# Patient Record
Sex: Female | Born: 1940 | Race: White | Hispanic: No | Marital: Married | Smoking: Never smoker
Health system: Southern US, Community
[De-identification: ages and names within clinical notes are randomized; demographics above are authoritative.]

## PROBLEM LIST (undated history)

## (undated) DIAGNOSIS — I10 Essential (primary) hypertension: Secondary | ICD-10-CM

## (undated) DIAGNOSIS — E119 Type 2 diabetes mellitus without complications: Secondary | ICD-10-CM

## (undated) DIAGNOSIS — G629 Polyneuropathy, unspecified: Secondary | ICD-10-CM

## (undated) DIAGNOSIS — IMO0001 Reserved for inherently not codable concepts without codable children: Secondary | ICD-10-CM

## (undated) DIAGNOSIS — IMO0002 Reserved for concepts with insufficient information to code with codable children: Secondary | ICD-10-CM

## (undated) DIAGNOSIS — C801 Malignant (primary) neoplasm, unspecified: Secondary | ICD-10-CM

## (undated) HISTORY — PX: ABDOMINAL HYSTERECTOMY: SHX81

---

## 2009-08-01 ENCOUNTER — Encounter: Admission: RE | Admit: 2009-08-01 | Discharge: 2009-08-01 | Payer: Self-pay | Admitting: Podiatry

## 2010-12-02 ENCOUNTER — Encounter: Payer: Self-pay | Admitting: Sports Medicine

## 2015-11-12 ENCOUNTER — Emergency Department (HOSPITAL_COMMUNITY): Payer: Medicare Other

## 2015-11-12 ENCOUNTER — Encounter (HOSPITAL_COMMUNITY): Payer: Self-pay

## 2015-11-12 ENCOUNTER — Inpatient Hospital Stay (HOSPITAL_COMMUNITY)
Admission: EM | Admit: 2015-11-12 | Discharge: 2015-11-15 | DRG: 184 | Disposition: A | Discharge status: Skilled Nursing Facility | Payer: Medicare Other | Attending: General Surgery | Admitting: General Surgery

## 2015-11-12 DIAGNOSIS — Z880 Allergy status to penicillin: Secondary | ICD-10-CM

## 2015-11-12 DIAGNOSIS — Z882 Allergy status to sulfonamides status: Secondary | ICD-10-CM

## 2015-11-12 DIAGNOSIS — E119 Type 2 diabetes mellitus without complications: Secondary | ICD-10-CM | POA: Insufficient documentation

## 2015-11-12 DIAGNOSIS — S81011A Laceration without foreign body, right knee, initial encounter: Secondary | ICD-10-CM | POA: Diagnosis present

## 2015-11-12 DIAGNOSIS — S81812A Laceration without foreign body, left lower leg, initial encounter: Secondary | ICD-10-CM | POA: Diagnosis present

## 2015-11-12 DIAGNOSIS — Y92410 Unspecified street and highway as the place of occurrence of the external cause: Secondary | ICD-10-CM

## 2015-11-12 DIAGNOSIS — S2220XA Unspecified fracture of sternum, initial encounter for closed fracture: Principal | ICD-10-CM | POA: Diagnosis present

## 2015-11-12 DIAGNOSIS — I10 Essential (primary) hypertension: Secondary | ICD-10-CM | POA: Diagnosis present

## 2015-11-12 DIAGNOSIS — Z885 Allergy status to narcotic agent status: Secondary | ICD-10-CM

## 2015-11-12 DIAGNOSIS — S71111A Laceration without foreign body, right thigh, initial encounter: Secondary | ICD-10-CM | POA: Diagnosis present

## 2015-11-12 DIAGNOSIS — Z881 Allergy status to other antibiotic agents status: Secondary | ICD-10-CM

## 2015-11-12 DIAGNOSIS — T07XXXA Unspecified multiple injuries, initial encounter: Secondary | ICD-10-CM

## 2015-11-12 DIAGNOSIS — Z85828 Personal history of other malignant neoplasm of skin: Secondary | ICD-10-CM

## 2015-11-12 DIAGNOSIS — S81811A Laceration without foreign body, right lower leg, initial encounter: Secondary | ICD-10-CM | POA: Diagnosis present

## 2015-11-12 DIAGNOSIS — S61411A Laceration without foreign body of right hand, initial encounter: Secondary | ICD-10-CM | POA: Diagnosis present

## 2015-11-12 DIAGNOSIS — T148 Other injury of unspecified body region: Secondary | ICD-10-CM | POA: Diagnosis present

## 2015-11-12 DIAGNOSIS — G629 Polyneuropathy, unspecified: Secondary | ICD-10-CM | POA: Diagnosis present

## 2015-11-12 DIAGNOSIS — S01419A Laceration without foreign body of unspecified cheek and temporomandibular area, initial encounter: Secondary | ICD-10-CM | POA: Diagnosis present

## 2015-11-12 DIAGNOSIS — S61511A Laceration without foreign body of right wrist, initial encounter: Secondary | ICD-10-CM | POA: Diagnosis present

## 2015-11-12 DIAGNOSIS — S81012A Laceration without foreign body, left knee, initial encounter: Secondary | ICD-10-CM | POA: Diagnosis present

## 2015-11-12 DIAGNOSIS — Z923 Personal history of irradiation: Secondary | ICD-10-CM

## 2015-11-12 DIAGNOSIS — D62 Acute posthemorrhagic anemia: Secondary | ICD-10-CM | POA: Diagnosis present

## 2015-11-12 HISTORY — DX: Essential (primary) hypertension: I10

## 2015-11-12 HISTORY — DX: Reserved for concepts with insufficient information to code with codable children: IMO0002

## 2015-11-12 HISTORY — DX: Malignant (primary) neoplasm, unspecified: C80.1

## 2015-11-12 HISTORY — DX: Reserved for inherently not codable concepts without codable children: IMO0001

## 2015-11-12 HISTORY — DX: Type 2 diabetes mellitus without complications: E11.9

## 2015-11-12 HISTORY — DX: Polyneuropathy, unspecified: G62.9

## 2015-11-12 LAB — COMPREHENSIVE METABOLIC PANEL
ALBUMIN: 3.5 g/dL (ref 3.5–5.0)
ALK PHOS: 71 U/L (ref 38–126)
ALT: 19 U/L (ref 14–54)
ANION GAP: 11 (ref 5–15)
AST: 22 U/L (ref 15–41)
BILIRUBIN TOTAL: 0.2 mg/dL — AB (ref 0.3–1.2)
BUN: 29 mg/dL — AB (ref 6–20)
CALCIUM: 9.2 mg/dL (ref 8.9–10.3)
CO2: 25 mmol/L (ref 22–32)
Chloride: 105 mmol/L (ref 101–111)
Creatinine, Ser: 1.25 mg/dL — ABNORMAL HIGH (ref 0.44–1.00)
GFR calc Af Amer: 48 mL/min — ABNORMAL LOW (ref >60)
GFR calc non Af Amer: 41 mL/min — ABNORMAL LOW (ref >60)
GLUCOSE: 137 mg/dL — AB (ref 65–99)
Potassium: 4.1 mmol/L (ref 3.5–5.1)
Sodium: 141 mmol/L (ref 135–145)
TOTAL PROTEIN: 6.3 g/dL — AB (ref 6.5–8.1)

## 2015-11-12 LAB — CDS SEROLOGY

## 2015-11-12 LAB — PROTIME-INR
INR: 0.94 (ref 0.00–1.49)
PROTHROMBIN TIME: 12.8 s (ref 11.6–15.2)

## 2015-11-12 LAB — GLUCOSE, CAPILLARY
GLUCOSE-CAPILLARY: 119 mg/dL — AB (ref 65–99)
GLUCOSE-CAPILLARY: 129 mg/dL — AB (ref 65–99)
GLUCOSE-CAPILLARY: 164 mg/dL — AB (ref 65–99)
Glucose-Capillary: 95 mg/dL (ref 65–99)

## 2015-11-12 LAB — ETHANOL

## 2015-11-12 LAB — CBC
HCT: 30.9 % — ABNORMAL LOW (ref 36.0–46.0)
Hemoglobin: 9.7 g/dL — ABNORMAL LOW (ref 12.0–15.0)
MCH: 28.1 pg (ref 26.0–34.0)
MCHC: 31.4 g/dL (ref 30.0–36.0)
MCV: 89.6 fL (ref 78.0–100.0)
Platelets: 243 10*3/uL (ref 150–400)
RBC: 3.45 MIL/uL — ABNORMAL LOW (ref 3.87–5.11)
RDW: 15.9 % — AB (ref 11.5–15.5)
WBC: 8.8 10*3/uL (ref 4.0–10.5)

## 2015-11-12 MED ORDER — SODIUM CHLORIDE 0.9 % IV BOLUS (SEPSIS)
125.0000 mL | Freq: Once | INTRAVENOUS | Status: AC
  Administered 2015-11-12: 1000 mL via INTRAVENOUS

## 2015-11-12 MED ORDER — HYDROMORPHONE HCL 1 MG/ML IJ SOLN: 0.5000 mg | INTRAMUSCULAR | Status: DC | PRN

## 2015-11-12 MED ORDER — DOCUSATE SODIUM 100 MG PO CAPS
100.0000 mg | ORAL_CAPSULE | Freq: Two times a day (BID) | ORAL | Status: DC
Start: 2015-11-12 — End: 2015-11-15
  Administered 2015-11-12 – 2015-11-15 (×7): 100 mg via ORAL
  Filled 2015-11-12 (×7): qty 1

## 2015-11-12 MED ORDER — HYDROCODONE-ACETAMINOPHEN 5-325 MG PO TABS
1.0000 | ORAL_TABLET | ORAL | Status: DC | PRN
  Administered 2015-11-12: 1 via ORAL
  Filled 2015-11-12: qty 1

## 2015-11-12 MED ORDER — HEPARIN SODIUM (PORCINE) 5000 UNIT/ML IJ SOLN
5000.0000 [IU] | Freq: Three times a day (TID) | INTRAMUSCULAR | Status: DC
  Administered 2015-11-12: 5000 [IU] via SUBCUTANEOUS
  Filled 2015-11-12: qty 1

## 2015-11-12 MED ORDER — NAPROXEN 250 MG PO TABS
500.0000 mg | ORAL_TABLET | Freq: Two times a day (BID) | ORAL | Status: DC
  Administered 2015-11-12 – 2015-11-15 (×7): 500 mg via ORAL
  Filled 2015-11-12 (×7): qty 2

## 2015-11-12 MED ORDER — IOHEXOL 300 MG/ML  SOLN
100.0000 mL | Freq: Once | INTRAMUSCULAR | Status: AC | PRN
  Administered 2015-11-12: 80 mL via INTRAVENOUS

## 2015-11-12 MED ORDER — KCL IN DEXTROSE-NACL 20-5-0.45 MEQ/L-%-% IV SOLN
INTRAVENOUS | Status: DC
  Administered 2015-11-12 – 2015-11-13 (×2): via INTRAVENOUS
  Filled 2015-11-12 (×5): qty 1000

## 2015-11-12 MED ORDER — ENOXAPARIN SODIUM 40 MG/0.4ML ~~LOC~~ SOLN
40.0000 mg | SUBCUTANEOUS | Status: DC
  Administered 2015-11-12 – 2015-11-14 (×3): 40 mg via SUBCUTANEOUS
  Filled 2015-11-12 (×4): qty 0.4

## 2015-11-12 MED ORDER — TRAMADOL HCL 50 MG PO TABS
50.0000 mg | ORAL_TABLET | Freq: Four times a day (QID) | ORAL | Status: DC | PRN
  Administered 2015-11-12 – 2015-11-13 (×4): 100 mg via ORAL
  Administered 2015-11-14: 50 mg via ORAL
  Administered 2015-11-14: 100 mg via ORAL
  Filled 2015-11-12 (×4): qty 2
  Filled 2015-11-12: qty 1
  Filled 2015-11-12: qty 2

## 2015-11-12 MED ORDER — INSULIN ASPART 100 UNIT/ML ~~LOC~~ SOLN
0.0000 [IU] | Freq: Three times a day (TID) | SUBCUTANEOUS | Status: DC
  Administered 2015-11-12 – 2015-11-13 (×2): 2 [IU] via SUBCUTANEOUS
  Administered 2015-11-13: 3 [IU] via SUBCUTANEOUS
  Administered 2015-11-14: 2 [IU] via SUBCUTANEOUS

## 2015-11-12 MED ORDER — ONDANSETRON HCL 4 MG/2ML IJ SOLN: 4.0000 mg | Freq: Four times a day (QID) | INTRAMUSCULAR | Status: DC | PRN

## 2015-11-12 MED ORDER — ONDANSETRON HCL 4 MG PO TABS: 4.0000 mg | ORAL_TABLET | Freq: Four times a day (QID) | ORAL | Status: DC | PRN
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
## 2015-11-12 NOTE — ED Notes (Signed)
MD at bedside. 

## 2015-11-12 NOTE — ED Notes (Signed)
Family at bedside. 
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
## 2015-11-12 NOTE — ED Notes (Signed)
Bleeding noted to L wrist/hand, laceration noted, pressure bandage noted. Pt c/o worsening chest pain

## 2015-11-13 LAB — GLUCOSE, CAPILLARY
Glucose-Capillary: 135 mg/dL — ABNORMAL HIGH (ref 65–99)
Glucose-Capillary: 136 mg/dL — ABNORMAL HIGH (ref 65–99)
Glucose-Capillary: 177 mg/dL — ABNORMAL HIGH (ref 65–99)

## 2015-11-13 MED ORDER — MONTELUKAST SODIUM 10 MG PO TABS
10.0000 mg | ORAL_TABLET | Freq: Every day | ORAL | Status: DC
  Administered 2015-11-13 – 2015-11-14 (×2): 10 mg via ORAL
  Filled 2015-11-13 (×2): qty 1

## 2015-11-13 MED ORDER — ASPIRIN EC 81 MG PO TBEC
81.0000 mg | DELAYED_RELEASE_TABLET | Freq: Every day | ORAL | Status: DC
  Administered 2015-11-13 – 2015-11-15 (×3): 81 mg via ORAL
  Filled 2015-11-13 (×3): qty 1

## 2015-11-13 MED ORDER — FLUTICASONE PROPIONATE 50 MCG/ACT NA SUSP
2.0000 | Freq: Every day | NASAL | Status: DC
  Administered 2015-11-13 – 2015-11-14 (×2): 2 via NASAL
  Filled 2015-11-13 (×2): qty 16

## 2015-11-13 MED ORDER — BRIMONIDINE TARTRATE 0.15 % OP SOLN
1.0000 [drp] | Freq: Two times a day (BID) | OPHTHALMIC | Status: DC
  Administered 2015-11-13 – 2015-11-15 (×3): 1 [drp] via OPHTHALMIC
  Filled 2015-11-13 (×2): qty 5

## 2015-11-13 MED ORDER — AMLODIPINE BESYLATE 5 MG PO TABS
5.0000 mg | ORAL_TABLET | Freq: Every day | ORAL | Status: DC
  Administered 2015-11-14 – 2015-11-15 (×2): 5 mg via ORAL
  Filled 2015-11-13 (×2): qty 1

## 2015-11-13 MED ORDER — PAROXETINE HCL 20 MG PO TABS
20.0000 mg | ORAL_TABLET | Freq: Every day | ORAL | Status: DC
  Administered 2015-11-15: 20 mg via ORAL
  Filled 2015-11-13 (×3): qty 1

## 2015-11-13 MED ORDER — LORATADINE 10 MG PO TABS
10.0000 mg | ORAL_TABLET | Freq: Every day | ORAL | Status: DC
  Administered 2015-11-13 – 2015-11-15 (×3): 10 mg via ORAL
  Filled 2015-11-13 (×3): qty 1

## 2015-11-13 MED ORDER — GABAPENTIN 300 MG PO CAPS
600.0000 mg | ORAL_CAPSULE | Freq: Three times a day (TID) | ORAL | Status: DC
  Administered 2015-11-13 – 2015-11-15 (×7): 600 mg via ORAL
  Filled 2015-11-13 (×7): qty 2

## 2015-11-13 MED ORDER — PANTOPRAZOLE SODIUM 40 MG PO TBEC
40.0000 mg | DELAYED_RELEASE_TABLET | Freq: Every day | ORAL | Status: DC
  Administered 2015-11-13 – 2015-11-15 (×3): 40 mg via ORAL
  Filled 2015-11-13 (×3): qty 1

## 2015-11-13 MED ORDER — FAMOTIDINE 20 MG PO TABS
20.0000 mg | ORAL_TABLET | Freq: Every day | ORAL | Status: DC
  Administered 2015-11-13 – 2015-11-14 (×2): 20 mg via ORAL
  Filled 2015-11-13 (×2): qty 1

## 2015-11-13 MED ORDER — ATORVASTATIN CALCIUM 40 MG PO TABS
40.0000 mg | ORAL_TABLET | Freq: Every day | ORAL | Status: DC
  Administered 2015-11-13 – 2015-11-14 (×2): 40 mg via ORAL
  Filled 2015-11-13 (×2): qty 1

## 2015-11-13 MED ORDER — DORZOLAMIDE HCL-TIMOLOL MAL 2-0.5 % OP SOLN
1.0000 [drp] | Freq: Two times a day (BID) | OPHTHALMIC | Status: DC
  Administered 2015-11-13 – 2015-11-15 (×3): 1 [drp] via OPHTHALMIC
  Filled 2015-11-13 (×3): qty 10

## 2015-11-13 NOTE — Clinical Social Work Placement (Signed)
   CLINICAL SOCIAL WORK PLACEMENT  NOTE  Date:  11/13/2015  Patient Details  Name: Sharon Garza MRN: [id number] Date of Birth: Aug 06, 1941  Clinical Social Work is seeking post-discharge placement for this patient at the [place] level of care (*CSW will initial, date and re-position this form in  chart as items are completed):  Yes   Patient/family provided with [place] Work Department's list of facilities offering this level of care within the geographic area requested by the patient (or if unable, by the patient's family).  Yes   Patient/family informed of their freedom to choose among providers that offer the needed level of care, that participate in Medicare, Medicaid or managed care program needed by the patient, have an available bed and are willing to accept the patient.  Yes   Patient/family informed of [place]'s ownership interest in [facility] and [facility], as well as of the fact that they are under no obligation to receive care at these facilities.  PASRR submitted to EDS on       PASRR number received on       Existing PASRR number confirmed on 11/13/15     FL2 transmitted to all facilities in geographic area requested by pt/family on 11/13/15     FL2 transmitted to all facilities within larger geographic area on       Patient informed that his/her managed care company has contracts with or will negotiate with certain facilities, including the following:            Patient/family informed of bed offers received.  Patient chooses bed at       Physician recommends and patient chooses bed at      Patient to be transferred to   on  .  Patient to be transferred to facility by       Patient family notified on   of transfer.  Name of family member notified:        PHYSICIAN       Additional Comment:
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
## 2015-11-13 NOTE — Progress Notes (Signed)
OT Cancellation Note  Patient Details Name: Autie Westenberger MRN: [id number] DOB: 07-11-1941   Cancelled Treatment:    Reason Eval/Treat Not Completed: Other (comment) (RN changing pts dressings). Will check back for OT eval as time allows.   Binnie Kand M.S., OTR/L Pager: [phone]  11/13/2015, 11:25 AM

## 2015-11-14 DIAGNOSIS — D62 Acute posthemorrhagic anemia: Secondary | ICD-10-CM | POA: Diagnosis not present

## 2015-11-14 DIAGNOSIS — T07XXXA Unspecified multiple injuries, initial encounter: Secondary | ICD-10-CM

## 2015-11-14 LAB — GLUCOSE, CAPILLARY
GLUCOSE-CAPILLARY: 99 mg/dL (ref 65–99)
GLUCOSE-CAPILLARY: 103 mg/dL — AB (ref 65–99)
Glucose-Capillary: 100 mg/dL — ABNORMAL HIGH (ref 65–99)
Glucose-Capillary: 121 mg/dL — ABNORMAL HIGH (ref 65–99)

## 2015-11-14 LAB — CREATININE, SERUM
Creatinine, Ser: 1.02 mg/dL — ABNORMAL HIGH (ref 0.44–1.00)
GFR calc non Af Amer: 52 mL/min — ABNORMAL LOW (ref >60)

## 2015-11-14 MED ORDER — BACITRACIN ZINC 500 UNIT/GM EX OINT
TOPICAL_OINTMENT | Freq: Two times a day (BID) | CUTANEOUS | Status: DC
  Administered 2015-11-14 – 2015-11-15 (×3): via TOPICAL
  Filled 2015-11-14 (×2): qty 28.35

## 2015-11-14 MED ORDER — POLYETHYLENE GLYCOL 3350 17 G PO PACK
17.0000 g | PACK | Freq: Every day | ORAL | Status: DC
  Filled 2015-11-14: qty 1

## 2015-11-14 MED ORDER — SODIUM CHLORIDE 0.9 % IV BOLUS (SEPSIS)
500.0000 mL | Freq: Once | INTRAVENOUS | Status: AC
  Administered 2015-11-14: 500 mL via INTRAVENOUS

## 2015-11-14 MED ORDER — METFORMIN HCL 500 MG PO TABS
500.0000 mg | ORAL_TABLET | Freq: Two times a day (BID) | ORAL | Status: DC
  Administered 2015-11-14 – 2015-11-15 (×2): 500 mg via ORAL
  Filled 2015-11-14 (×2): qty 1
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
## 2015-11-14 NOTE — Clinical Social Work Placement (Signed)
   CLINICAL SOCIAL WORK PLACEMENT  NOTE  Date:  11/14/2015  Patient Details  Name: Sharon Garza MRN: [id number] Date of Birth: 1941/06/16  Clinical Social Work is seeking post-discharge placement for this patient at the [place] level of care (*CSW will initial, date and re-position this form in  chart as items are completed):  Yes   Patient/family provided with [place] Work Department's list of facilities offering this level of care within the geographic area requested by the patient (or if unable, by the patient's family).  Yes   Patient/family informed of their freedom to choose among providers that offer the needed level of care, that participate in Medicare, Medicaid or managed care program needed by the patient, have an available bed and are willing to accept the patient.  Yes   Patient/family informed of [facility]'s ownership interest in [facility] and [facility], as well as of the fact that they are under no obligation to receive care at these facilities.  PASRR submitted to EDS on       PASRR number received on       Existing PASRR number confirmed on 11/13/15     FL2 transmitted to all facilities in geographic area requested by pt/family on 11/13/15     FL2 transmitted to all facilities within larger geographic area on       Patient informed that his/her managed care company has contracts with or will negotiate with certain facilities, including the following:        Yes   Patient/family informed of bed offers received.  Patient chooses bed at  [facility])     Physician recommends and patient chooses bed at      Patient to be transferred to  [facility]) on 11/15/15.  Patient to be transferred to facility by [facility]     Patient family notified on 11/14/15 of transfer.  Name of family member notified:  [name] (patient daughter) at bedside     PHYSICIAN Please prepare priority discharge summary, including  medications     Additional Comment:    Barbette Or, [place]

## 2015-11-14 NOTE — Clinical Social Work Note (Signed)
Clinical Social Worker continuing to follow patient and family for support and discharge planning needs.  Patient was offered a bed at [facility] and has accepted bed offer.  Patient family has agreed to complete paperwork in the morning and CSW to discharge patient via family car tomorrow afternoon.  Facility hopeful for patient spouse to be able to admit at discharge also, however will need to review all medical information prior to offering a bed.  CSW remains available for support and to facilitate patient discharge needs once medically stable.  Barbette Or, [place]
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
## 2015-11-15 LAB — GLUCOSE, CAPILLARY
GLUCOSE-CAPILLARY: 86 mg/dL (ref 65–99)
Glucose-Capillary: 107 mg/dL — ABNORMAL HIGH (ref 65–99)

## 2015-11-15 MED ORDER — TRAMADOL HCL 50 MG PO TABS: 50.0000 mg | ORAL_TABLET | Freq: Four times a day (QID) | ORAL | Status: DC | PRN

## 2015-11-15 MED ORDER — BACITRACIN ZINC 500 UNIT/GM EX OINT: TOPICAL_OINTMENT | Freq: Two times a day (BID) | CUTANEOUS | Status: DC

## 2015-11-15 NOTE — Care Management Important Message (Signed)
Important Message  Patient Details  Name: Sharon Garza MRN: [id number] Date of Birth: [birth year]   Medicare Important Message Given:  Yes    Hadrian Yarbrough P Delrae Hagey 11/15/2015, 1:23 PM
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
## 2015-11-15 NOTE — Clinical Social Work Note (Signed)
Clinical Social Worker facilitated patient discharge including contacting patient family and facility to confirm patient discharge plans.  Clinical information faxed to facility and family agreeable with plan.  CSW arranged transport via family to [place].  RN to call report prior to discharge.  Clinical Social Worker will sign off for now as social work intervention is no longer needed. Please consult [place] again if new need arises.  Barbette Or, [place]

## 2015-11-15 NOTE — Care Management Note (Signed)
Case Management Note  Patient Details  Name: Sharon Garza MRN: [id number] Date of Birth: [birth year]  Subjective/Objective:    Pt medically stable for dc today.                  Action/Plan: Plan dc to SNF today, per CSW arrangements.    Expected Discharge Date:   11/15/15               Expected Discharge Plan:  Skilled Nursing Facility  In-House Referral:  Clinical Social Work  Discharge planning Services  CM Consult  Post Acute Care Choice:    Choice offered to:     DME Arranged:    DME Agency:     HH Arranged:    [place] Agency:     Status of Service:  Completed, signed off  Medicare Important Message Given:    Date Medicare IM Given:    Medicare IM give by:    Date Additional Medicare IM Given:    Additional Medicare Important Message give by:     If discussed at [place] of Stay Meetings, dates discussed:    Additional Comments:  Reinaldo Raddle, RN, BSN  Trauma/Neuro ICU Case Manager [phone]

## 2015-11-15 NOTE — Progress Notes (Signed)
IV removed. All dressings changed.  Belongings packed. Transportation arranged with family to [place].  Report called to [employer].  Family taking patient to visit her husband in the ICU before travelling to [place].
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
## 2023-02-12 DIAGNOSIS — F419 Anxiety disorder, unspecified: Secondary | ICD-10-CM | POA: Diagnosis not present

## 2023-02-12 DIAGNOSIS — G8929 Other chronic pain: Secondary | ICD-10-CM | POA: Diagnosis not present

## 2023-02-12 DIAGNOSIS — F331 Major depressive disorder, recurrent, moderate: Secondary | ICD-10-CM | POA: Diagnosis not present

## 2023-02-12 DIAGNOSIS — E1143 Type 2 diabetes mellitus with diabetic autonomic (poly)neuropathy: Secondary | ICD-10-CM | POA: Diagnosis not present

## 2023-02-12 DIAGNOSIS — I739 Peripheral vascular disease, unspecified: Secondary | ICD-10-CM | POA: Diagnosis not present

## 2023-02-12 DIAGNOSIS — F329 Major depressive disorder, single episode, unspecified: Secondary | ICD-10-CM | POA: Diagnosis not present

## 2023-02-12 DIAGNOSIS — E785 Hyperlipidemia, unspecified: Secondary | ICD-10-CM | POA: Diagnosis not present

## 2023-02-12 DIAGNOSIS — I10 Essential (primary) hypertension: Secondary | ICD-10-CM | POA: Diagnosis not present

## 2023-02-17 DIAGNOSIS — I10 Essential (primary) hypertension: Secondary | ICD-10-CM | POA: Diagnosis not present

## 2023-02-17 DIAGNOSIS — Z85828 Personal history of other malignant neoplasm of skin: Secondary | ICD-10-CM | POA: Diagnosis not present

## 2023-02-17 DIAGNOSIS — C44519 Basal cell carcinoma of skin of other part of trunk: Secondary | ICD-10-CM | POA: Diagnosis not present

## 2023-02-17 DIAGNOSIS — Z86008 Personal history of in-situ neoplasm of other site: Secondary | ICD-10-CM | POA: Diagnosis not present

## 2023-02-17 DIAGNOSIS — C44619 Basal cell carcinoma of skin of left upper limb, including shoulder: Secondary | ICD-10-CM | POA: Diagnosis not present

## 2023-02-17 DIAGNOSIS — C44529 Squamous cell carcinoma of skin of other part of trunk: Secondary | ICD-10-CM | POA: Diagnosis not present

## 2023-02-17 DIAGNOSIS — I739 Peripheral vascular disease, unspecified: Secondary | ICD-10-CM | POA: Diagnosis not present

## 2023-02-17 DIAGNOSIS — C96A Histiocytic sarcoma: Secondary | ICD-10-CM | POA: Diagnosis not present

## 2023-02-17 DIAGNOSIS — E1143 Type 2 diabetes mellitus with diabetic autonomic (poly)neuropathy: Secondary | ICD-10-CM | POA: Diagnosis not present

## 2023-02-17 DIAGNOSIS — L570 Actinic keratosis: Secondary | ICD-10-CM | POA: Diagnosis not present

## 2023-02-17 DIAGNOSIS — D485 Neoplasm of uncertain behavior of skin: Secondary | ICD-10-CM | POA: Diagnosis not present

## 2023-02-17 DIAGNOSIS — L97529 Non-pressure chronic ulcer of other part of left foot with unspecified severity: Secondary | ICD-10-CM | POA: Diagnosis not present

## 2023-02-17 DIAGNOSIS — D045 Carcinoma in situ of skin of trunk: Secondary | ICD-10-CM | POA: Diagnosis not present

## 2023-02-20 DIAGNOSIS — L97529 Non-pressure chronic ulcer of other part of left foot with unspecified severity: Secondary | ICD-10-CM | POA: Diagnosis not present

## 2023-02-20 DIAGNOSIS — L03113 Cellulitis of right upper limb: Secondary | ICD-10-CM | POA: Diagnosis not present

## 2023-02-20 DIAGNOSIS — G8929 Other chronic pain: Secondary | ICD-10-CM | POA: Diagnosis not present

## 2023-02-21 DIAGNOSIS — R600 Localized edema: Secondary | ICD-10-CM | POA: Diagnosis not present

## 2023-02-21 DIAGNOSIS — I809 Phlebitis and thrombophlebitis of unspecified site: Secondary | ICD-10-CM | POA: Diagnosis not present

## 2023-02-24 DIAGNOSIS — R600 Localized edema: Secondary | ICD-10-CM | POA: Diagnosis not present

## 2023-02-24 DIAGNOSIS — L03113 Cellulitis of right upper limb: Secondary | ICD-10-CM | POA: Diagnosis not present

## 2023-02-28 DIAGNOSIS — L97829 Non-pressure chronic ulcer of other part of left lower leg with unspecified severity: Secondary | ICD-10-CM | POA: Diagnosis not present

## 2023-02-28 DIAGNOSIS — I87312 Chronic venous hypertension (idiopathic) with ulcer of left lower extremity: Secondary | ICD-10-CM | POA: Diagnosis not present

## 2023-02-28 DIAGNOSIS — F411 Generalized anxiety disorder: Secondary | ICD-10-CM | POA: Diagnosis not present

## 2023-02-28 DIAGNOSIS — L97529 Non-pressure chronic ulcer of other part of left foot with unspecified severity: Secondary | ICD-10-CM | POA: Diagnosis not present

## 2023-03-03 DIAGNOSIS — L03113 Cellulitis of right upper limb: Secondary | ICD-10-CM | POA: Diagnosis not present

## 2023-03-03 DIAGNOSIS — I10 Essential (primary) hypertension: Secondary | ICD-10-CM | POA: Diagnosis not present

## 2023-03-03 DIAGNOSIS — G893 Neoplasm related pain (acute) (chronic): Secondary | ICD-10-CM | POA: Diagnosis not present

## 2023-03-03 DIAGNOSIS — R600 Localized edema: Secondary | ICD-10-CM | POA: Diagnosis not present

## 2023-03-03 DIAGNOSIS — I739 Peripheral vascular disease, unspecified: Secondary | ICD-10-CM | POA: Diagnosis not present

## 2023-03-14 DIAGNOSIS — G8929 Other chronic pain: Secondary | ICD-10-CM | POA: Diagnosis not present

## 2023-03-14 DIAGNOSIS — F419 Anxiety disorder, unspecified: Secondary | ICD-10-CM | POA: Diagnosis not present

## 2023-03-14 DIAGNOSIS — F331 Major depressive disorder, recurrent, moderate: Secondary | ICD-10-CM | POA: Diagnosis not present

## 2023-03-14 DIAGNOSIS — E1143 Type 2 diabetes mellitus with diabetic autonomic (poly)neuropathy: Secondary | ICD-10-CM | POA: Diagnosis not present

## 2023-03-14 DIAGNOSIS — I10 Essential (primary) hypertension: Secondary | ICD-10-CM | POA: Diagnosis not present

## 2023-03-14 DIAGNOSIS — I739 Peripheral vascular disease, unspecified: Secondary | ICD-10-CM | POA: Diagnosis not present

## 2023-03-17 DIAGNOSIS — I83023 Varicose veins of left lower extremity with ulcer of ankle: Secondary | ICD-10-CM | POA: Diagnosis not present

## 2023-03-17 DIAGNOSIS — I739 Peripheral vascular disease, unspecified: Secondary | ICD-10-CM | POA: Diagnosis not present

## 2023-03-17 DIAGNOSIS — L97329 Non-pressure chronic ulcer of left ankle with unspecified severity: Secondary | ICD-10-CM | POA: Diagnosis not present

## 2023-03-17 DIAGNOSIS — I872 Venous insufficiency (chronic) (peripheral): Secondary | ICD-10-CM | POA: Diagnosis not present

## 2023-03-19 DIAGNOSIS — J90 Pleural effusion, not elsewhere classified: Secondary | ICD-10-CM | POA: Diagnosis not present

## 2023-03-19 DIAGNOSIS — I517 Cardiomegaly: Secondary | ICD-10-CM | POA: Diagnosis not present

## 2023-03-19 DIAGNOSIS — J984 Other disorders of lung: Secondary | ICD-10-CM | POA: Diagnosis not present

## 2023-03-27 DIAGNOSIS — L97829 Non-pressure chronic ulcer of other part of left lower leg with unspecified severity: Secondary | ICD-10-CM | POA: Diagnosis not present

## 2023-03-27 DIAGNOSIS — I87312 Chronic venous hypertension (idiopathic) with ulcer of left lower extremity: Secondary | ICD-10-CM | POA: Diagnosis not present

## 2023-03-27 DIAGNOSIS — L97529 Non-pressure chronic ulcer of other part of left foot with unspecified severity: Secondary | ICD-10-CM | POA: Diagnosis not present

## 2023-03-27 DIAGNOSIS — L989 Disorder of the skin and subcutaneous tissue, unspecified: Secondary | ICD-10-CM | POA: Diagnosis not present

## 2023-04-01 DIAGNOSIS — R531 Weakness: Secondary | ICD-10-CM | POA: Diagnosis not present

## 2023-04-01 DIAGNOSIS — R112 Nausea with vomiting, unspecified: Secondary | ICD-10-CM | POA: Diagnosis not present

## 2023-04-02 DIAGNOSIS — R112 Nausea with vomiting, unspecified: Secondary | ICD-10-CM | POA: Diagnosis not present

## 2023-04-02 DIAGNOSIS — R627 Adult failure to thrive: Secondary | ICD-10-CM | POA: Diagnosis not present

## 2023-04-02 DIAGNOSIS — I739 Peripheral vascular disease, unspecified: Secondary | ICD-10-CM | POA: Diagnosis not present

## 2023-04-02 DIAGNOSIS — E1143 Type 2 diabetes mellitus with diabetic autonomic (poly)neuropathy: Secondary | ICD-10-CM | POA: Diagnosis not present

## 2023-04-02 DIAGNOSIS — I10 Essential (primary) hypertension: Secondary | ICD-10-CM | POA: Diagnosis not present

## 2023-04-02 DIAGNOSIS — G629 Polyneuropathy, unspecified: Secondary | ICD-10-CM | POA: Diagnosis not present

## 2023-04-04 DIAGNOSIS — G9341 Metabolic encephalopathy: Secondary | ICD-10-CM | POA: Diagnosis not present

## 2023-04-04 DIAGNOSIS — E1121 Type 2 diabetes mellitus with diabetic nephropathy: Secondary | ICD-10-CM | POA: Diagnosis not present

## 2023-04-04 DIAGNOSIS — J9622 Acute and chronic respiratory failure with hypercapnia: Secondary | ICD-10-CM | POA: Diagnosis not present

## 2023-04-04 DIAGNOSIS — I272 Pulmonary hypertension, unspecified: Secondary | ICD-10-CM | POA: Diagnosis not present

## 2023-04-04 DIAGNOSIS — C439 Malignant melanoma of skin, unspecified: Secondary | ICD-10-CM | POA: Diagnosis not present

## 2023-04-04 DIAGNOSIS — J440 Chronic obstructive pulmonary disease with acute lower respiratory infection: Secondary | ICD-10-CM | POA: Diagnosis not present

## 2023-04-04 DIAGNOSIS — I452 Bifascicular block: Secondary | ICD-10-CM | POA: Diagnosis not present

## 2023-04-04 DIAGNOSIS — R945 Abnormal results of liver function studies: Secondary | ICD-10-CM | POA: Diagnosis not present

## 2023-04-04 DIAGNOSIS — I129 Hypertensive chronic kidney disease with stage 1 through stage 4 chronic kidney disease, or unspecified chronic kidney disease: Secondary | ICD-10-CM | POA: Diagnosis not present

## 2023-04-04 DIAGNOSIS — R7989 Other specified abnormal findings of blood chemistry: Secondary | ICD-10-CM | POA: Diagnosis not present

## 2023-04-04 DIAGNOSIS — R0989 Other specified symptoms and signs involving the circulatory and respiratory systems: Secondary | ICD-10-CM | POA: Diagnosis not present

## 2023-04-04 DIAGNOSIS — R627 Adult failure to thrive: Secondary | ICD-10-CM | POA: Diagnosis not present

## 2023-04-04 DIAGNOSIS — M7989 Other specified soft tissue disorders: Secondary | ICD-10-CM | POA: Diagnosis not present

## 2023-04-04 DIAGNOSIS — R0902 Hypoxemia: Secondary | ICD-10-CM | POA: Diagnosis not present

## 2023-04-04 DIAGNOSIS — I5032 Chronic diastolic (congestive) heart failure: Secondary | ICD-10-CM | POA: Diagnosis not present

## 2023-04-04 DIAGNOSIS — E1151 Type 2 diabetes mellitus with diabetic peripheral angiopathy without gangrene: Secondary | ICD-10-CM | POA: Diagnosis not present

## 2023-04-04 DIAGNOSIS — J9691 Respiratory failure, unspecified with hypoxia: Secondary | ICD-10-CM | POA: Diagnosis not present

## 2023-04-04 DIAGNOSIS — E8729 Other acidosis: Secondary | ICD-10-CM | POA: Diagnosis not present

## 2023-04-04 DIAGNOSIS — J9601 Acute respiratory failure with hypoxia: Secondary | ICD-10-CM | POA: Diagnosis not present

## 2023-04-04 DIAGNOSIS — C96A Histiocytic sarcoma: Secondary | ICD-10-CM | POA: Diagnosis not present

## 2023-04-04 DIAGNOSIS — K449 Diaphragmatic hernia without obstruction or gangrene: Secondary | ICD-10-CM | POA: Diagnosis not present

## 2023-04-04 DIAGNOSIS — I82721 Chronic embolism and thrombosis of deep veins of right upper extremity: Secondary | ICD-10-CM | POA: Diagnosis not present

## 2023-04-04 DIAGNOSIS — L97522 Non-pressure chronic ulcer of other part of left foot with fat layer exposed: Secondary | ICD-10-CM | POA: Diagnosis not present

## 2023-04-04 DIAGNOSIS — I509 Heart failure, unspecified: Secondary | ICD-10-CM | POA: Diagnosis not present

## 2023-04-04 DIAGNOSIS — N200 Calculus of kidney: Secondary | ICD-10-CM | POA: Diagnosis not present

## 2023-04-04 DIAGNOSIS — J90 Pleural effusion, not elsewhere classified: Secondary | ICD-10-CM | POA: Diagnosis not present

## 2023-04-04 DIAGNOSIS — G629 Polyneuropathy, unspecified: Secondary | ICD-10-CM | POA: Diagnosis not present

## 2023-04-04 DIAGNOSIS — I10 Essential (primary) hypertension: Secondary | ICD-10-CM | POA: Diagnosis not present

## 2023-04-04 DIAGNOSIS — R531 Weakness: Secondary | ICD-10-CM | POA: Diagnosis not present

## 2023-04-04 DIAGNOSIS — R4182 Altered mental status, unspecified: Secondary | ICD-10-CM | POA: Diagnosis not present

## 2023-04-04 DIAGNOSIS — Z743 Need for continuous supervision: Secondary | ICD-10-CM | POA: Diagnosis not present

## 2023-04-04 DIAGNOSIS — G9340 Encephalopathy, unspecified: Secondary | ICD-10-CM | POA: Diagnosis not present

## 2023-04-04 DIAGNOSIS — J159 Unspecified bacterial pneumonia: Secondary | ICD-10-CM | POA: Diagnosis not present

## 2023-04-04 DIAGNOSIS — A419 Sepsis, unspecified organism: Secondary | ICD-10-CM | POA: Diagnosis not present

## 2023-04-04 DIAGNOSIS — J189 Pneumonia, unspecified organism: Secondary | ICD-10-CM | POA: Diagnosis not present

## 2023-04-04 DIAGNOSIS — I083 Combined rheumatic disorders of mitral, aortic and tricuspid valves: Secondary | ICD-10-CM | POA: Diagnosis not present

## 2023-04-04 DIAGNOSIS — J9811 Atelectasis: Secondary | ICD-10-CM | POA: Diagnosis not present

## 2023-04-04 DIAGNOSIS — I152 Hypertension secondary to endocrine disorders: Secondary | ICD-10-CM | POA: Diagnosis not present

## 2023-04-04 DIAGNOSIS — N179 Acute kidney failure, unspecified: Secondary | ICD-10-CM | POA: Diagnosis not present

## 2023-04-04 DIAGNOSIS — J9600 Acute respiratory failure, unspecified whether with hypoxia or hypercapnia: Secondary | ICD-10-CM | POA: Diagnosis not present

## 2023-04-04 DIAGNOSIS — I739 Peripheral vascular disease, unspecified: Secondary | ICD-10-CM | POA: Diagnosis not present

## 2023-04-05 DIAGNOSIS — G9341 Metabolic encephalopathy: Secondary | ICD-10-CM | POA: Diagnosis not present

## 2023-04-06 DIAGNOSIS — I452 Bifascicular block: Secondary | ICD-10-CM | POA: Diagnosis not present

## 2023-04-06 DIAGNOSIS — I10 Essential (primary) hypertension: Secondary | ICD-10-CM | POA: Diagnosis not present

## 2023-04-06 DIAGNOSIS — E1151 Type 2 diabetes mellitus with diabetic peripheral angiopathy without gangrene: Secondary | ICD-10-CM | POA: Diagnosis not present

## 2023-04-06 DIAGNOSIS — R7989 Other specified abnormal findings of blood chemistry: Secondary | ICD-10-CM | POA: Diagnosis not present

## 2023-04-06 DIAGNOSIS — G9341 Metabolic encephalopathy: Secondary | ICD-10-CM | POA: Diagnosis not present

## 2023-04-06 DIAGNOSIS — G9340 Encephalopathy, unspecified: Secondary | ICD-10-CM | POA: Diagnosis not present

## 2023-04-07 DIAGNOSIS — E1151 Type 2 diabetes mellitus with diabetic peripheral angiopathy without gangrene: Secondary | ICD-10-CM | POA: Diagnosis not present

## 2023-04-07 DIAGNOSIS — I5032 Chronic diastolic (congestive) heart failure: Secondary | ICD-10-CM | POA: Diagnosis not present

## 2023-04-07 DIAGNOSIS — G9340 Encephalopathy, unspecified: Secondary | ICD-10-CM | POA: Diagnosis not present

## 2023-04-07 DIAGNOSIS — I083 Combined rheumatic disorders of mitral, aortic and tricuspid valves: Secondary | ICD-10-CM | POA: Diagnosis not present

## 2023-04-07 DIAGNOSIS — I129 Hypertensive chronic kidney disease with stage 1 through stage 4 chronic kidney disease, or unspecified chronic kidney disease: Secondary | ICD-10-CM | POA: Diagnosis not present

## 2023-04-07 DIAGNOSIS — I272 Pulmonary hypertension, unspecified: Secondary | ICD-10-CM | POA: Diagnosis not present

## 2023-04-08 DIAGNOSIS — J9600 Acute respiratory failure, unspecified whether with hypoxia or hypercapnia: Secondary | ICD-10-CM | POA: Diagnosis not present

## 2023-04-08 DIAGNOSIS — R627 Adult failure to thrive: Secondary | ICD-10-CM | POA: Diagnosis not present

## 2023-04-08 DIAGNOSIS — E1121 Type 2 diabetes mellitus with diabetic nephropathy: Secondary | ICD-10-CM | POA: Diagnosis not present

## 2023-04-08 DIAGNOSIS — R531 Weakness: Secondary | ICD-10-CM | POA: Diagnosis not present

## 2023-04-08 DIAGNOSIS — L97522 Non-pressure chronic ulcer of other part of left foot with fat layer exposed: Secondary | ICD-10-CM | POA: Diagnosis not present

## 2023-04-08 DIAGNOSIS — I739 Peripheral vascular disease, unspecified: Secondary | ICD-10-CM | POA: Diagnosis not present

## 2023-04-08 DIAGNOSIS — G629 Polyneuropathy, unspecified: Secondary | ICD-10-CM | POA: Diagnosis not present

## 2023-04-08 DIAGNOSIS — C96A Histiocytic sarcoma: Secondary | ICD-10-CM | POA: Diagnosis not present

## 2023-04-08 DIAGNOSIS — I152 Hypertension secondary to endocrine disorders: Secondary | ICD-10-CM | POA: Diagnosis not present

## 2023-04-08 DIAGNOSIS — Z743 Need for continuous supervision: Secondary | ICD-10-CM | POA: Diagnosis not present

## 2023-04-08 DIAGNOSIS — J189 Pneumonia, unspecified organism: Secondary | ICD-10-CM | POA: Diagnosis not present

## 2023-04-08 DIAGNOSIS — G9340 Encephalopathy, unspecified: Secondary | ICD-10-CM | POA: Diagnosis not present

## 2023-04-08 DIAGNOSIS — E1151 Type 2 diabetes mellitus with diabetic peripheral angiopathy without gangrene: Secondary | ICD-10-CM | POA: Diagnosis not present

## 2023-04-09 DIAGNOSIS — G311 Senile degeneration of brain, not elsewhere classified: Secondary | ICD-10-CM | POA: Diagnosis not present

## 2023-04-09 DIAGNOSIS — J209 Acute bronchitis, unspecified: Secondary | ICD-10-CM | POA: Diagnosis not present

## 2023-04-09 DIAGNOSIS — E1121 Type 2 diabetes mellitus with diabetic nephropathy: Secondary | ICD-10-CM | POA: Diagnosis not present

## 2023-04-09 DIAGNOSIS — J189 Pneumonia, unspecified organism: Secondary | ICD-10-CM | POA: Diagnosis not present

## 2023-04-09 DIAGNOSIS — I69320 Aphasia following cerebral infarction: Secondary | ICD-10-CM | POA: Diagnosis not present

## 2023-04-09 DIAGNOSIS — J441 Chronic obstructive pulmonary disease with (acute) exacerbation: Secondary | ICD-10-CM | POA: Diagnosis not present

## 2023-04-09 DIAGNOSIS — N3000 Acute cystitis without hematuria: Secondary | ICD-10-CM | POA: Diagnosis not present

## 2023-04-09 DIAGNOSIS — M6281 Muscle weakness (generalized): Secondary | ICD-10-CM | POA: Diagnosis not present

## 2023-04-09 DIAGNOSIS — J9600 Acute respiratory failure, unspecified whether with hypoxia or hypercapnia: Secondary | ICD-10-CM | POA: Diagnosis not present

## 2023-04-09 DIAGNOSIS — F0390 Unspecified dementia without behavioral disturbance: Secondary | ICD-10-CM | POA: Diagnosis not present

## 2023-04-10 DIAGNOSIS — J209 Acute bronchitis, unspecified: Secondary | ICD-10-CM | POA: Diagnosis not present

## 2023-04-10 DIAGNOSIS — M6281 Muscle weakness (generalized): Secondary | ICD-10-CM | POA: Diagnosis not present

## 2023-04-10 DIAGNOSIS — E1121 Type 2 diabetes mellitus with diabetic nephropathy: Secondary | ICD-10-CM | POA: Diagnosis not present

## 2023-04-10 DIAGNOSIS — I69320 Aphasia following cerebral infarction: Secondary | ICD-10-CM | POA: Diagnosis not present

## 2023-04-10 DIAGNOSIS — F0390 Unspecified dementia without behavioral disturbance: Secondary | ICD-10-CM | POA: Diagnosis not present

## 2023-04-10 DIAGNOSIS — G311 Senile degeneration of brain, not elsewhere classified: Secondary | ICD-10-CM | POA: Diagnosis not present

## 2023-04-11 DIAGNOSIS — F0390 Unspecified dementia without behavioral disturbance: Secondary | ICD-10-CM | POA: Diagnosis not present

## 2023-04-11 DIAGNOSIS — I69320 Aphasia following cerebral infarction: Secondary | ICD-10-CM | POA: Diagnosis not present

## 2023-04-11 DIAGNOSIS — M6281 Muscle weakness (generalized): Secondary | ICD-10-CM | POA: Diagnosis not present

## 2023-04-11 DIAGNOSIS — G311 Senile degeneration of brain, not elsewhere classified: Secondary | ICD-10-CM | POA: Diagnosis not present

## 2023-04-11 DIAGNOSIS — E1121 Type 2 diabetes mellitus with diabetic nephropathy: Secondary | ICD-10-CM | POA: Diagnosis not present

## 2023-04-11 DIAGNOSIS — J209 Acute bronchitis, unspecified: Secondary | ICD-10-CM | POA: Diagnosis not present

## 2023-04-12 DIAGNOSIS — E1143 Type 2 diabetes mellitus with diabetic autonomic (poly)neuropathy: Secondary | ICD-10-CM | POA: Diagnosis not present

## 2023-04-12 DIAGNOSIS — I10 Essential (primary) hypertension: Secondary | ICD-10-CM | POA: Diagnosis not present

## 2023-04-12 DIAGNOSIS — E785 Hyperlipidemia, unspecified: Secondary | ICD-10-CM | POA: Diagnosis not present

## 2023-04-12 DIAGNOSIS — F419 Anxiety disorder, unspecified: Secondary | ICD-10-CM | POA: Diagnosis not present

## 2023-04-12 DIAGNOSIS — F331 Major depressive disorder, recurrent, moderate: Secondary | ICD-10-CM | POA: Diagnosis not present

## 2023-04-12 DIAGNOSIS — I739 Peripheral vascular disease, unspecified: Secondary | ICD-10-CM | POA: Diagnosis not present

## 2023-04-14 DIAGNOSIS — R2681 Unsteadiness on feet: Secondary | ICD-10-CM | POA: Diagnosis not present

## 2023-04-14 DIAGNOSIS — M1712 Unilateral primary osteoarthritis, left knee: Secondary | ICD-10-CM | POA: Diagnosis not present

## 2023-04-14 DIAGNOSIS — M353 Polymyalgia rheumatica: Secondary | ICD-10-CM | POA: Diagnosis not present

## 2023-04-14 DIAGNOSIS — M6281 Muscle weakness (generalized): Secondary | ICD-10-CM | POA: Diagnosis not present

## 2023-04-14 DIAGNOSIS — R1310 Dysphagia, unspecified: Secondary | ICD-10-CM | POA: Diagnosis not present

## 2023-04-14 DIAGNOSIS — E1121 Type 2 diabetes mellitus with diabetic nephropathy: Secondary | ICD-10-CM | POA: Diagnosis not present

## 2023-04-15 DIAGNOSIS — M353 Polymyalgia rheumatica: Secondary | ICD-10-CM | POA: Diagnosis not present

## 2023-04-15 DIAGNOSIS — R2681 Unsteadiness on feet: Secondary | ICD-10-CM | POA: Diagnosis not present

## 2023-04-15 DIAGNOSIS — M6281 Muscle weakness (generalized): Secondary | ICD-10-CM | POA: Diagnosis not present

## 2023-04-15 DIAGNOSIS — D045 Carcinoma in situ of skin of trunk: Secondary | ICD-10-CM | POA: Diagnosis not present

## 2023-04-15 DIAGNOSIS — E1121 Type 2 diabetes mellitus with diabetic nephropathy: Secondary | ICD-10-CM | POA: Diagnosis not present

## 2023-04-15 DIAGNOSIS — R1310 Dysphagia, unspecified: Secondary | ICD-10-CM | POA: Diagnosis not present

## 2023-04-15 DIAGNOSIS — M1712 Unilateral primary osteoarthritis, left knee: Secondary | ICD-10-CM | POA: Diagnosis not present

## 2023-04-15 DIAGNOSIS — C44619 Basal cell carcinoma of skin of left upper limb, including shoulder: Secondary | ICD-10-CM | POA: Diagnosis not present

## 2023-04-16 DIAGNOSIS — M353 Polymyalgia rheumatica: Secondary | ICD-10-CM | POA: Diagnosis not present

## 2023-04-16 DIAGNOSIS — E1121 Type 2 diabetes mellitus with diabetic nephropathy: Secondary | ICD-10-CM | POA: Diagnosis not present

## 2023-04-16 DIAGNOSIS — M6281 Muscle weakness (generalized): Secondary | ICD-10-CM | POA: Diagnosis not present

## 2023-04-16 DIAGNOSIS — M1712 Unilateral primary osteoarthritis, left knee: Secondary | ICD-10-CM | POA: Diagnosis not present

## 2023-04-16 DIAGNOSIS — R1310 Dysphagia, unspecified: Secondary | ICD-10-CM | POA: Diagnosis not present

## 2023-04-16 DIAGNOSIS — R2681 Unsteadiness on feet: Secondary | ICD-10-CM | POA: Diagnosis not present

## 2023-04-17 DIAGNOSIS — M6281 Muscle weakness (generalized): Secondary | ICD-10-CM | POA: Diagnosis not present

## 2023-04-17 DIAGNOSIS — M1712 Unilateral primary osteoarthritis, left knee: Secondary | ICD-10-CM | POA: Diagnosis not present

## 2023-04-17 DIAGNOSIS — R2681 Unsteadiness on feet: Secondary | ICD-10-CM | POA: Diagnosis not present

## 2023-04-17 DIAGNOSIS — M353 Polymyalgia rheumatica: Secondary | ICD-10-CM | POA: Diagnosis not present

## 2023-04-17 DIAGNOSIS — E1121 Type 2 diabetes mellitus with diabetic nephropathy: Secondary | ICD-10-CM | POA: Diagnosis not present

## 2023-04-17 DIAGNOSIS — R1310 Dysphagia, unspecified: Secondary | ICD-10-CM | POA: Diagnosis not present

## 2023-04-18 DIAGNOSIS — M1712 Unilateral primary osteoarthritis, left knee: Secondary | ICD-10-CM | POA: Diagnosis not present

## 2023-04-18 DIAGNOSIS — R2681 Unsteadiness on feet: Secondary | ICD-10-CM | POA: Diagnosis not present

## 2023-04-18 DIAGNOSIS — M353 Polymyalgia rheumatica: Secondary | ICD-10-CM | POA: Diagnosis not present

## 2023-04-18 DIAGNOSIS — M6281 Muscle weakness (generalized): Secondary | ICD-10-CM | POA: Diagnosis not present

## 2023-04-18 DIAGNOSIS — R1310 Dysphagia, unspecified: Secondary | ICD-10-CM | POA: Diagnosis not present

## 2023-04-18 DIAGNOSIS — E1121 Type 2 diabetes mellitus with diabetic nephropathy: Secondary | ICD-10-CM | POA: Diagnosis not present

## 2023-04-20 DIAGNOSIS — E1121 Type 2 diabetes mellitus with diabetic nephropathy: Secondary | ICD-10-CM | POA: Diagnosis not present

## 2023-04-20 DIAGNOSIS — R2681 Unsteadiness on feet: Secondary | ICD-10-CM | POA: Diagnosis not present

## 2023-04-20 DIAGNOSIS — M1712 Unilateral primary osteoarthritis, left knee: Secondary | ICD-10-CM | POA: Diagnosis not present

## 2023-04-20 DIAGNOSIS — M6281 Muscle weakness (generalized): Secondary | ICD-10-CM | POA: Diagnosis not present

## 2023-04-20 DIAGNOSIS — R1310 Dysphagia, unspecified: Secondary | ICD-10-CM | POA: Diagnosis not present

## 2023-04-20 DIAGNOSIS — M353 Polymyalgia rheumatica: Secondary | ICD-10-CM | POA: Diagnosis not present

## 2023-04-22 DIAGNOSIS — R2681 Unsteadiness on feet: Secondary | ICD-10-CM | POA: Diagnosis not present

## 2023-04-22 DIAGNOSIS — M353 Polymyalgia rheumatica: Secondary | ICD-10-CM | POA: Diagnosis not present

## 2023-04-22 DIAGNOSIS — M1712 Unilateral primary osteoarthritis, left knee: Secondary | ICD-10-CM | POA: Diagnosis not present

## 2023-04-22 DIAGNOSIS — M6281 Muscle weakness (generalized): Secondary | ICD-10-CM | POA: Diagnosis not present

## 2023-04-22 DIAGNOSIS — C44519 Basal cell carcinoma of skin of other part of trunk: Secondary | ICD-10-CM | POA: Diagnosis not present

## 2023-04-22 DIAGNOSIS — E1121 Type 2 diabetes mellitus with diabetic nephropathy: Secondary | ICD-10-CM | POA: Diagnosis not present

## 2023-04-22 DIAGNOSIS — R1310 Dysphagia, unspecified: Secondary | ICD-10-CM | POA: Diagnosis not present

## 2023-04-23 DIAGNOSIS — R2681 Unsteadiness on feet: Secondary | ICD-10-CM | POA: Diagnosis not present

## 2023-04-23 DIAGNOSIS — E1121 Type 2 diabetes mellitus with diabetic nephropathy: Secondary | ICD-10-CM | POA: Diagnosis not present

## 2023-04-23 DIAGNOSIS — R1310 Dysphagia, unspecified: Secondary | ICD-10-CM | POA: Diagnosis not present

## 2023-04-23 DIAGNOSIS — M6281 Muscle weakness (generalized): Secondary | ICD-10-CM | POA: Diagnosis not present

## 2023-04-23 DIAGNOSIS — M1712 Unilateral primary osteoarthritis, left knee: Secondary | ICD-10-CM | POA: Diagnosis not present

## 2023-04-23 DIAGNOSIS — M353 Polymyalgia rheumatica: Secondary | ICD-10-CM | POA: Diagnosis not present

## 2023-04-24 DIAGNOSIS — M6281 Muscle weakness (generalized): Secondary | ICD-10-CM | POA: Diagnosis not present

## 2023-04-24 DIAGNOSIS — R1310 Dysphagia, unspecified: Secondary | ICD-10-CM | POA: Diagnosis not present

## 2023-04-24 DIAGNOSIS — E1121 Type 2 diabetes mellitus with diabetic nephropathy: Secondary | ICD-10-CM | POA: Diagnosis not present

## 2023-04-24 DIAGNOSIS — M1712 Unilateral primary osteoarthritis, left knee: Secondary | ICD-10-CM | POA: Diagnosis not present

## 2023-04-24 DIAGNOSIS — R2681 Unsteadiness on feet: Secondary | ICD-10-CM | POA: Diagnosis not present

## 2023-04-24 DIAGNOSIS — M353 Polymyalgia rheumatica: Secondary | ICD-10-CM | POA: Diagnosis not present

## 2023-04-26 DIAGNOSIS — S81811D Laceration without foreign body, right lower leg, subsequent encounter: Secondary | ICD-10-CM | POA: Diagnosis not present

## 2023-04-26 DIAGNOSIS — S21112D Laceration without foreign body of left front wall of thorax without penetration into thoracic cavity, subsequent encounter: Secondary | ICD-10-CM | POA: Diagnosis not present

## 2023-04-26 DIAGNOSIS — L989 Disorder of the skin and subcutaneous tissue, unspecified: Secondary | ICD-10-CM | POA: Diagnosis not present

## 2023-05-06 DIAGNOSIS — R945 Abnormal results of liver function studies: Secondary | ICD-10-CM | POA: Diagnosis not present

## 2023-05-07 DIAGNOSIS — G893 Neoplasm related pain (acute) (chronic): Secondary | ICD-10-CM | POA: Diagnosis not present

## 2023-05-07 DIAGNOSIS — E119 Type 2 diabetes mellitus without complications: Secondary | ICD-10-CM | POA: Diagnosis not present

## 2023-05-07 DIAGNOSIS — M1460 Charcot's joint, unspecified site: Secondary | ICD-10-CM | POA: Diagnosis not present

## 2023-05-07 DIAGNOSIS — G8929 Other chronic pain: Secondary | ICD-10-CM | POA: Diagnosis not present

## 2023-05-07 DIAGNOSIS — F329 Major depressive disorder, single episode, unspecified: Secondary | ICD-10-CM | POA: Diagnosis not present

## 2023-05-07 DIAGNOSIS — K219 Gastro-esophageal reflux disease without esophagitis: Secondary | ICD-10-CM | POA: Diagnosis not present

## 2023-05-07 DIAGNOSIS — J449 Chronic obstructive pulmonary disease, unspecified: Secondary | ICD-10-CM | POA: Diagnosis not present

## 2023-05-07 DIAGNOSIS — D649 Anemia, unspecified: Secondary | ICD-10-CM | POA: Diagnosis not present

## 2023-05-14 DIAGNOSIS — F419 Anxiety disorder, unspecified: Secondary | ICD-10-CM | POA: Diagnosis not present

## 2023-05-14 DIAGNOSIS — E785 Hyperlipidemia, unspecified: Secondary | ICD-10-CM | POA: Diagnosis not present

## 2023-05-14 DIAGNOSIS — I10 Essential (primary) hypertension: Secondary | ICD-10-CM | POA: Diagnosis not present

## 2023-05-14 DIAGNOSIS — F331 Major depressive disorder, recurrent, moderate: Secondary | ICD-10-CM | POA: Diagnosis not present

## 2023-05-14 DIAGNOSIS — G8929 Other chronic pain: Secondary | ICD-10-CM | POA: Diagnosis not present

## 2023-05-14 DIAGNOSIS — E1143 Type 2 diabetes mellitus with diabetic autonomic (poly)neuropathy: Secondary | ICD-10-CM | POA: Diagnosis not present

## 2023-05-14 DIAGNOSIS — I739 Peripheral vascular disease, unspecified: Secondary | ICD-10-CM | POA: Diagnosis not present

## 2023-05-14 DIAGNOSIS — F329 Major depressive disorder, single episode, unspecified: Secondary | ICD-10-CM | POA: Diagnosis not present

## 2023-05-20 DIAGNOSIS — L97329 Non-pressure chronic ulcer of left ankle with unspecified severity: Secondary | ICD-10-CM | POA: Diagnosis not present

## 2023-05-20 DIAGNOSIS — I83023 Varicose veins of left lower extremity with ulcer of ankle: Secondary | ICD-10-CM | POA: Diagnosis not present

## 2023-06-02 DIAGNOSIS — C96A Histiocytic sarcoma: Secondary | ICD-10-CM | POA: Diagnosis not present

## 2023-06-02 DIAGNOSIS — M1460 Charcot's joint, unspecified site: Secondary | ICD-10-CM | POA: Diagnosis not present

## 2023-06-02 DIAGNOSIS — J449 Chronic obstructive pulmonary disease, unspecified: Secondary | ICD-10-CM | POA: Diagnosis not present

## 2023-06-02 DIAGNOSIS — I10 Essential (primary) hypertension: Secondary | ICD-10-CM | POA: Diagnosis not present

## 2023-06-02 DIAGNOSIS — G8929 Other chronic pain: Secondary | ICD-10-CM | POA: Diagnosis not present

## 2023-06-02 DIAGNOSIS — E1143 Type 2 diabetes mellitus with diabetic autonomic (poly)neuropathy: Secondary | ICD-10-CM | POA: Diagnosis not present

## 2023-06-04 DIAGNOSIS — F411 Generalized anxiety disorder: Secondary | ICD-10-CM | POA: Diagnosis not present

## 2023-06-04 DIAGNOSIS — I87312 Chronic venous hypertension (idiopathic) with ulcer of left lower extremity: Secondary | ICD-10-CM | POA: Diagnosis not present

## 2023-06-04 DIAGNOSIS — L97529 Non-pressure chronic ulcer of other part of left foot with unspecified severity: Secondary | ICD-10-CM | POA: Diagnosis not present

## 2023-06-04 DIAGNOSIS — F331 Major depressive disorder, recurrent, moderate: Secondary | ICD-10-CM | POA: Diagnosis not present

## 2023-06-10 DIAGNOSIS — F411 Generalized anxiety disorder: Secondary | ICD-10-CM | POA: Diagnosis not present

## 2023-06-10 DIAGNOSIS — F331 Major depressive disorder, recurrent, moderate: Secondary | ICD-10-CM | POA: Diagnosis not present

## 2023-06-16 DIAGNOSIS — E1143 Type 2 diabetes mellitus with diabetic autonomic (poly)neuropathy: Secondary | ICD-10-CM | POA: Diagnosis not present

## 2023-06-16 DIAGNOSIS — I10 Essential (primary) hypertension: Secondary | ICD-10-CM | POA: Diagnosis not present

## 2023-06-16 DIAGNOSIS — G8929 Other chronic pain: Secondary | ICD-10-CM | POA: Diagnosis not present

## 2023-06-16 DIAGNOSIS — F419 Anxiety disorder, unspecified: Secondary | ICD-10-CM | POA: Diagnosis not present

## 2023-06-16 DIAGNOSIS — I739 Peripheral vascular disease, unspecified: Secondary | ICD-10-CM | POA: Diagnosis not present

## 2023-06-16 DIAGNOSIS — F331 Major depressive disorder, recurrent, moderate: Secondary | ICD-10-CM | POA: Diagnosis not present

## 2023-06-17 DIAGNOSIS — F331 Major depressive disorder, recurrent, moderate: Secondary | ICD-10-CM | POA: Diagnosis not present

## 2023-06-17 DIAGNOSIS — F411 Generalized anxiety disorder: Secondary | ICD-10-CM | POA: Diagnosis not present

## 2023-06-24 DIAGNOSIS — F331 Major depressive disorder, recurrent, moderate: Secondary | ICD-10-CM | POA: Diagnosis not present

## 2023-06-24 DIAGNOSIS — F411 Generalized anxiety disorder: Secondary | ICD-10-CM | POA: Diagnosis not present

## 2023-07-03 DIAGNOSIS — E1143 Type 2 diabetes mellitus with diabetic autonomic (poly)neuropathy: Secondary | ICD-10-CM | POA: Diagnosis not present

## 2023-07-03 DIAGNOSIS — J449 Chronic obstructive pulmonary disease, unspecified: Secondary | ICD-10-CM | POA: Diagnosis not present

## 2023-07-03 DIAGNOSIS — I10 Essential (primary) hypertension: Secondary | ICD-10-CM | POA: Diagnosis not present

## 2023-07-03 DIAGNOSIS — C96A Histiocytic sarcoma: Secondary | ICD-10-CM | POA: Diagnosis not present

## 2023-07-08 DIAGNOSIS — J209 Acute bronchitis, unspecified: Secondary | ICD-10-CM | POA: Diagnosis not present

## 2023-07-08 DIAGNOSIS — G311 Senile degeneration of brain, not elsewhere classified: Secondary | ICD-10-CM | POA: Diagnosis not present

## 2023-07-08 DIAGNOSIS — R1310 Dysphagia, unspecified: Secondary | ICD-10-CM | POA: Diagnosis not present

## 2023-07-08 DIAGNOSIS — F411 Generalized anxiety disorder: Secondary | ICD-10-CM | POA: Diagnosis not present

## 2023-07-08 DIAGNOSIS — E1121 Type 2 diabetes mellitus with diabetic nephropathy: Secondary | ICD-10-CM | POA: Diagnosis not present

## 2023-07-08 DIAGNOSIS — M6281 Muscle weakness (generalized): Secondary | ICD-10-CM | POA: Diagnosis not present

## 2023-07-08 DIAGNOSIS — F0390 Unspecified dementia without behavioral disturbance: Secondary | ICD-10-CM | POA: Diagnosis not present

## 2023-07-08 DIAGNOSIS — F331 Major depressive disorder, recurrent, moderate: Secondary | ICD-10-CM | POA: Diagnosis not present

## 2023-07-09 DIAGNOSIS — R1310 Dysphagia, unspecified: Secondary | ICD-10-CM | POA: Diagnosis not present

## 2023-07-09 DIAGNOSIS — G311 Senile degeneration of brain, not elsewhere classified: Secondary | ICD-10-CM | POA: Diagnosis not present

## 2023-07-09 DIAGNOSIS — E1121 Type 2 diabetes mellitus with diabetic nephropathy: Secondary | ICD-10-CM | POA: Diagnosis not present

## 2023-07-09 DIAGNOSIS — M6281 Muscle weakness (generalized): Secondary | ICD-10-CM | POA: Diagnosis not present

## 2023-07-09 DIAGNOSIS — F0390 Unspecified dementia without behavioral disturbance: Secondary | ICD-10-CM | POA: Diagnosis not present

## 2023-07-09 DIAGNOSIS — J209 Acute bronchitis, unspecified: Secondary | ICD-10-CM | POA: Diagnosis not present

## 2023-07-10 DIAGNOSIS — G311 Senile degeneration of brain, not elsewhere classified: Secondary | ICD-10-CM | POA: Diagnosis not present

## 2023-07-10 DIAGNOSIS — E1121 Type 2 diabetes mellitus with diabetic nephropathy: Secondary | ICD-10-CM | POA: Diagnosis not present

## 2023-07-10 DIAGNOSIS — F0390 Unspecified dementia without behavioral disturbance: Secondary | ICD-10-CM | POA: Diagnosis not present

## 2023-07-10 DIAGNOSIS — J209 Acute bronchitis, unspecified: Secondary | ICD-10-CM | POA: Diagnosis not present

## 2023-07-10 DIAGNOSIS — R1310 Dysphagia, unspecified: Secondary | ICD-10-CM | POA: Diagnosis not present

## 2023-07-10 DIAGNOSIS — M6281 Muscle weakness (generalized): Secondary | ICD-10-CM | POA: Diagnosis not present

## 2023-07-11 DIAGNOSIS — R1310 Dysphagia, unspecified: Secondary | ICD-10-CM | POA: Diagnosis not present

## 2023-07-11 DIAGNOSIS — M6281 Muscle weakness (generalized): Secondary | ICD-10-CM | POA: Diagnosis not present

## 2023-07-11 DIAGNOSIS — G311 Senile degeneration of brain, not elsewhere classified: Secondary | ICD-10-CM | POA: Diagnosis not present

## 2023-07-11 DIAGNOSIS — E1121 Type 2 diabetes mellitus with diabetic nephropathy: Secondary | ICD-10-CM | POA: Diagnosis not present

## 2023-07-11 DIAGNOSIS — J209 Acute bronchitis, unspecified: Secondary | ICD-10-CM | POA: Diagnosis not present

## 2023-07-11 DIAGNOSIS — F0390 Unspecified dementia without behavioral disturbance: Secondary | ICD-10-CM | POA: Diagnosis not present

## 2023-07-14 DIAGNOSIS — R1310 Dysphagia, unspecified: Secondary | ICD-10-CM | POA: Diagnosis not present

## 2023-07-14 DIAGNOSIS — F0390 Unspecified dementia without behavioral disturbance: Secondary | ICD-10-CM | POA: Diagnosis not present

## 2023-07-14 DIAGNOSIS — R2681 Unsteadiness on feet: Secondary | ICD-10-CM | POA: Diagnosis not present

## 2023-07-14 DIAGNOSIS — E1121 Type 2 diabetes mellitus with diabetic nephropathy: Secondary | ICD-10-CM | POA: Diagnosis not present

## 2023-07-14 DIAGNOSIS — M6281 Muscle weakness (generalized): Secondary | ICD-10-CM | POA: Diagnosis not present

## 2023-07-15 DIAGNOSIS — F0390 Unspecified dementia without behavioral disturbance: Secondary | ICD-10-CM | POA: Diagnosis not present

## 2023-07-15 DIAGNOSIS — R1310 Dysphagia, unspecified: Secondary | ICD-10-CM | POA: Diagnosis not present

## 2023-07-15 DIAGNOSIS — R2681 Unsteadiness on feet: Secondary | ICD-10-CM | POA: Diagnosis not present

## 2023-07-15 DIAGNOSIS — M6281 Muscle weakness (generalized): Secondary | ICD-10-CM | POA: Diagnosis not present

## 2023-07-15 DIAGNOSIS — E1121 Type 2 diabetes mellitus with diabetic nephropathy: Secondary | ICD-10-CM | POA: Diagnosis not present

## 2023-07-16 DIAGNOSIS — F329 Major depressive disorder, single episode, unspecified: Secondary | ICD-10-CM | POA: Diagnosis not present

## 2023-07-16 DIAGNOSIS — F331 Major depressive disorder, recurrent, moderate: Secondary | ICD-10-CM | POA: Diagnosis not present

## 2023-07-16 DIAGNOSIS — F419 Anxiety disorder, unspecified: Secondary | ICD-10-CM | POA: Diagnosis not present

## 2023-07-16 DIAGNOSIS — I10 Essential (primary) hypertension: Secondary | ICD-10-CM | POA: Diagnosis not present

## 2023-07-16 DIAGNOSIS — N3281 Overactive bladder: Secondary | ICD-10-CM | POA: Diagnosis not present

## 2023-07-16 DIAGNOSIS — R1310 Dysphagia, unspecified: Secondary | ICD-10-CM | POA: Diagnosis not present

## 2023-07-16 DIAGNOSIS — K219 Gastro-esophageal reflux disease without esophagitis: Secondary | ICD-10-CM | POA: Diagnosis not present

## 2023-07-16 DIAGNOSIS — E1143 Type 2 diabetes mellitus with diabetic autonomic (poly)neuropathy: Secondary | ICD-10-CM | POA: Diagnosis not present

## 2023-07-16 DIAGNOSIS — E1121 Type 2 diabetes mellitus with diabetic nephropathy: Secondary | ICD-10-CM | POA: Diagnosis not present

## 2023-07-16 DIAGNOSIS — G8929 Other chronic pain: Secondary | ICD-10-CM | POA: Diagnosis not present

## 2023-07-16 DIAGNOSIS — R2681 Unsteadiness on feet: Secondary | ICD-10-CM | POA: Diagnosis not present

## 2023-07-16 DIAGNOSIS — M6281 Muscle weakness (generalized): Secondary | ICD-10-CM | POA: Diagnosis not present

## 2023-07-16 DIAGNOSIS — E785 Hyperlipidemia, unspecified: Secondary | ICD-10-CM | POA: Diagnosis not present

## 2023-07-16 DIAGNOSIS — I739 Peripheral vascular disease, unspecified: Secondary | ICD-10-CM | POA: Diagnosis not present

## 2023-07-16 DIAGNOSIS — J309 Allergic rhinitis, unspecified: Secondary | ICD-10-CM | POA: Diagnosis not present

## 2023-07-16 DIAGNOSIS — F0390 Unspecified dementia without behavioral disturbance: Secondary | ICD-10-CM | POA: Diagnosis not present

## 2023-07-16 DIAGNOSIS — D649 Anemia, unspecified: Secondary | ICD-10-CM | POA: Diagnosis not present

## 2023-07-21 DIAGNOSIS — H401134 Primary open-angle glaucoma, bilateral, indeterminate stage: Secondary | ICD-10-CM | POA: Diagnosis not present

## 2023-07-30 DIAGNOSIS — F411 Generalized anxiety disorder: Secondary | ICD-10-CM | POA: Diagnosis not present

## 2023-07-30 DIAGNOSIS — F331 Major depressive disorder, recurrent, moderate: Secondary | ICD-10-CM | POA: Diagnosis not present

## 2023-07-31 DIAGNOSIS — F331 Major depressive disorder, recurrent, moderate: Secondary | ICD-10-CM | POA: Diagnosis not present

## 2023-07-31 DIAGNOSIS — F411 Generalized anxiety disorder: Secondary | ICD-10-CM | POA: Diagnosis not present

## 2023-08-04 DIAGNOSIS — E1143 Type 2 diabetes mellitus with diabetic autonomic (poly)neuropathy: Secondary | ICD-10-CM | POA: Diagnosis not present

## 2023-08-04 DIAGNOSIS — M1460 Charcot's joint, unspecified site: Secondary | ICD-10-CM | POA: Diagnosis not present

## 2023-08-04 DIAGNOSIS — J449 Chronic obstructive pulmonary disease, unspecified: Secondary | ICD-10-CM | POA: Diagnosis not present

## 2023-08-04 DIAGNOSIS — I739 Peripheral vascular disease, unspecified: Secondary | ICD-10-CM | POA: Diagnosis not present

## 2023-08-04 DIAGNOSIS — F411 Generalized anxiety disorder: Secondary | ICD-10-CM | POA: Diagnosis not present

## 2023-08-04 DIAGNOSIS — I10 Essential (primary) hypertension: Secondary | ICD-10-CM | POA: Diagnosis not present

## 2023-08-04 DIAGNOSIS — F331 Major depressive disorder, recurrent, moderate: Secondary | ICD-10-CM | POA: Diagnosis not present

## 2023-08-12 DIAGNOSIS — F331 Major depressive disorder, recurrent, moderate: Secondary | ICD-10-CM | POA: Diagnosis not present

## 2023-08-12 DIAGNOSIS — F411 Generalized anxiety disorder: Secondary | ICD-10-CM | POA: Diagnosis not present

## 2023-08-14 DIAGNOSIS — I87312 Chronic venous hypertension (idiopathic) with ulcer of left lower extremity: Secondary | ICD-10-CM | POA: Diagnosis not present

## 2023-08-14 DIAGNOSIS — L97829 Non-pressure chronic ulcer of other part of left lower leg with unspecified severity: Secondary | ICD-10-CM | POA: Diagnosis not present

## 2023-08-14 DIAGNOSIS — I10 Essential (primary) hypertension: Secondary | ICD-10-CM | POA: Diagnosis not present

## 2023-08-14 DIAGNOSIS — E1143 Type 2 diabetes mellitus with diabetic autonomic (poly)neuropathy: Secondary | ICD-10-CM | POA: Diagnosis not present

## 2023-08-14 DIAGNOSIS — G8929 Other chronic pain: Secondary | ICD-10-CM | POA: Diagnosis not present

## 2023-08-14 DIAGNOSIS — F329 Major depressive disorder, single episode, unspecified: Secondary | ICD-10-CM | POA: Diagnosis not present

## 2023-08-14 DIAGNOSIS — F331 Major depressive disorder, recurrent, moderate: Secondary | ICD-10-CM | POA: Diagnosis not present

## 2023-08-14 DIAGNOSIS — F419 Anxiety disorder, unspecified: Secondary | ICD-10-CM | POA: Diagnosis not present

## 2023-08-14 DIAGNOSIS — I739 Peripheral vascular disease, unspecified: Secondary | ICD-10-CM | POA: Diagnosis not present

## 2023-08-18 DIAGNOSIS — N3281 Overactive bladder: Secondary | ICD-10-CM | POA: Diagnosis not present

## 2023-08-18 DIAGNOSIS — F329 Major depressive disorder, single episode, unspecified: Secondary | ICD-10-CM | POA: Diagnosis not present

## 2023-08-18 DIAGNOSIS — C96A Histiocytic sarcoma: Secondary | ICD-10-CM | POA: Diagnosis not present

## 2023-08-18 DIAGNOSIS — J449 Chronic obstructive pulmonary disease, unspecified: Secondary | ICD-10-CM | POA: Diagnosis not present

## 2023-08-18 DIAGNOSIS — E785 Hyperlipidemia, unspecified: Secondary | ICD-10-CM | POA: Diagnosis not present

## 2023-08-18 DIAGNOSIS — I10 Essential (primary) hypertension: Secondary | ICD-10-CM | POA: Diagnosis not present

## 2023-08-18 DIAGNOSIS — H409 Unspecified glaucoma: Secondary | ICD-10-CM | POA: Diagnosis not present

## 2023-08-18 DIAGNOSIS — E119 Type 2 diabetes mellitus without complications: Secondary | ICD-10-CM | POA: Diagnosis not present

## 2023-08-18 DIAGNOSIS — E1143 Type 2 diabetes mellitus with diabetic autonomic (poly)neuropathy: Secondary | ICD-10-CM | POA: Diagnosis not present

## 2023-08-19 DIAGNOSIS — R531 Weakness: Secondary | ICD-10-CM | POA: Diagnosis not present

## 2023-08-26 DIAGNOSIS — F331 Major depressive disorder, recurrent, moderate: Secondary | ICD-10-CM | POA: Diagnosis not present

## 2023-08-26 DIAGNOSIS — F411 Generalized anxiety disorder: Secondary | ICD-10-CM | POA: Diagnosis not present

## 2023-09-02 DIAGNOSIS — F331 Major depressive disorder, recurrent, moderate: Secondary | ICD-10-CM | POA: Diagnosis not present

## 2023-09-02 DIAGNOSIS — F411 Generalized anxiety disorder: Secondary | ICD-10-CM | POA: Diagnosis not present

## 2023-09-10 DIAGNOSIS — I87312 Chronic venous hypertension (idiopathic) with ulcer of left lower extremity: Secondary | ICD-10-CM | POA: Diagnosis not present

## 2023-09-10 DIAGNOSIS — D045 Carcinoma in situ of skin of trunk: Secondary | ICD-10-CM | POA: Diagnosis not present

## 2023-09-10 DIAGNOSIS — C44519 Basal cell carcinoma of skin of other part of trunk: Secondary | ICD-10-CM | POA: Diagnosis not present

## 2023-09-10 DIAGNOSIS — C44529 Squamous cell carcinoma of skin of other part of trunk: Secondary | ICD-10-CM | POA: Diagnosis not present

## 2023-09-10 DIAGNOSIS — Z85828 Personal history of other malignant neoplasm of skin: Secondary | ICD-10-CM | POA: Diagnosis not present

## 2023-09-10 DIAGNOSIS — L97829 Non-pressure chronic ulcer of other part of left lower leg with unspecified severity: Secondary | ICD-10-CM | POA: Diagnosis not present

## 2023-09-10 DIAGNOSIS — D485 Neoplasm of uncertain behavior of skin: Secondary | ICD-10-CM | POA: Diagnosis not present

## 2023-09-10 DIAGNOSIS — C44619 Basal cell carcinoma of skin of left upper limb, including shoulder: Secondary | ICD-10-CM | POA: Diagnosis not present

## 2023-09-11 DIAGNOSIS — G893 Neoplasm related pain (acute) (chronic): Secondary | ICD-10-CM | POA: Diagnosis not present

## 2023-09-11 DIAGNOSIS — K219 Gastro-esophageal reflux disease without esophagitis: Secondary | ICD-10-CM | POA: Diagnosis not present

## 2023-09-11 DIAGNOSIS — I350 Nonrheumatic aortic (valve) stenosis: Secondary | ICD-10-CM | POA: Diagnosis not present

## 2023-09-11 DIAGNOSIS — I10 Essential (primary) hypertension: Secondary | ICD-10-CM | POA: Diagnosis not present

## 2023-09-11 DIAGNOSIS — F419 Anxiety disorder, unspecified: Secondary | ICD-10-CM | POA: Diagnosis not present

## 2023-09-11 DIAGNOSIS — J449 Chronic obstructive pulmonary disease, unspecified: Secondary | ICD-10-CM | POA: Diagnosis not present

## 2023-09-15 DIAGNOSIS — M1460 Charcot's joint, unspecified site: Secondary | ICD-10-CM | POA: Diagnosis not present

## 2023-09-15 DIAGNOSIS — E119 Type 2 diabetes mellitus without complications: Secondary | ICD-10-CM | POA: Diagnosis not present

## 2023-09-15 DIAGNOSIS — E1143 Type 2 diabetes mellitus with diabetic autonomic (poly)neuropathy: Secondary | ICD-10-CM | POA: Diagnosis not present

## 2023-09-15 DIAGNOSIS — K219 Gastro-esophageal reflux disease without esophagitis: Secondary | ICD-10-CM | POA: Diagnosis not present

## 2023-09-15 DIAGNOSIS — J449 Chronic obstructive pulmonary disease, unspecified: Secondary | ICD-10-CM | POA: Diagnosis not present

## 2023-09-15 DIAGNOSIS — I10 Essential (primary) hypertension: Secondary | ICD-10-CM | POA: Diagnosis not present

## 2023-09-15 DIAGNOSIS — H409 Unspecified glaucoma: Secondary | ICD-10-CM | POA: Diagnosis not present

## 2023-09-15 DIAGNOSIS — G8929 Other chronic pain: Secondary | ICD-10-CM | POA: Diagnosis not present

## 2023-09-15 DIAGNOSIS — C96A Histiocytic sarcoma: Secondary | ICD-10-CM | POA: Diagnosis not present

## 2023-09-16 DIAGNOSIS — G8929 Other chronic pain: Secondary | ICD-10-CM | POA: Diagnosis not present

## 2023-09-16 DIAGNOSIS — E1143 Type 2 diabetes mellitus with diabetic autonomic (poly)neuropathy: Secondary | ICD-10-CM | POA: Diagnosis not present

## 2023-09-16 DIAGNOSIS — I739 Peripheral vascular disease, unspecified: Secondary | ICD-10-CM | POA: Diagnosis not present

## 2023-09-16 DIAGNOSIS — I10 Essential (primary) hypertension: Secondary | ICD-10-CM | POA: Diagnosis not present

## 2023-09-16 DIAGNOSIS — E785 Hyperlipidemia, unspecified: Secondary | ICD-10-CM | POA: Diagnosis not present

## 2023-09-16 DIAGNOSIS — F331 Major depressive disorder, recurrent, moderate: Secondary | ICD-10-CM | POA: Diagnosis not present

## 2023-09-16 DIAGNOSIS — F419 Anxiety disorder, unspecified: Secondary | ICD-10-CM | POA: Diagnosis not present

## 2023-09-23 DIAGNOSIS — M1460 Charcot's joint, unspecified site: Secondary | ICD-10-CM | POA: Diagnosis not present

## 2023-09-23 DIAGNOSIS — F331 Major depressive disorder, recurrent, moderate: Secondary | ICD-10-CM | POA: Diagnosis not present

## 2023-09-23 DIAGNOSIS — R296 Repeated falls: Secondary | ICD-10-CM | POA: Diagnosis not present

## 2023-09-23 DIAGNOSIS — F411 Generalized anxiety disorder: Secondary | ICD-10-CM | POA: Diagnosis not present

## 2023-09-23 DIAGNOSIS — J449 Chronic obstructive pulmonary disease, unspecified: Secondary | ICD-10-CM | POA: Diagnosis not present

## 2023-09-29 DIAGNOSIS — U071 COVID-19: Secondary | ICD-10-CM | POA: Diagnosis not present

## 2023-09-29 DIAGNOSIS — M1460 Charcot's joint, unspecified site: Secondary | ICD-10-CM | POA: Diagnosis not present

## 2023-09-29 DIAGNOSIS — R296 Repeated falls: Secondary | ICD-10-CM | POA: Diagnosis not present

## 2023-09-29 DIAGNOSIS — J449 Chronic obstructive pulmonary disease, unspecified: Secondary | ICD-10-CM | POA: Diagnosis not present

## 2023-09-29 DIAGNOSIS — G8929 Other chronic pain: Secondary | ICD-10-CM | POA: Diagnosis not present

## 2023-09-29 DIAGNOSIS — R918 Other nonspecific abnormal finding of lung field: Secondary | ICD-10-CM | POA: Diagnosis not present

## 2023-10-07 DIAGNOSIS — G311 Senile degeneration of brain, not elsewhere classified: Secondary | ICD-10-CM | POA: Diagnosis not present

## 2023-10-07 DIAGNOSIS — E1121 Type 2 diabetes mellitus with diabetic nephropathy: Secondary | ICD-10-CM | POA: Diagnosis not present

## 2023-10-07 DIAGNOSIS — M6281 Muscle weakness (generalized): Secondary | ICD-10-CM | POA: Diagnosis not present

## 2023-10-07 DIAGNOSIS — R1310 Dysphagia, unspecified: Secondary | ICD-10-CM | POA: Diagnosis not present

## 2023-10-07 DIAGNOSIS — F0390 Unspecified dementia without behavioral disturbance: Secondary | ICD-10-CM | POA: Diagnosis not present

## 2023-10-07 DIAGNOSIS — K5790 Diverticulosis of intestine, part unspecified, without perforation or abscess without bleeding: Secondary | ICD-10-CM | POA: Diagnosis not present

## 2023-10-08 DIAGNOSIS — R1310 Dysphagia, unspecified: Secondary | ICD-10-CM | POA: Diagnosis not present

## 2023-10-08 DIAGNOSIS — F0390 Unspecified dementia without behavioral disturbance: Secondary | ICD-10-CM | POA: Diagnosis not present

## 2023-10-08 DIAGNOSIS — M6281 Muscle weakness (generalized): Secondary | ICD-10-CM | POA: Diagnosis not present

## 2023-10-08 DIAGNOSIS — G311 Senile degeneration of brain, not elsewhere classified: Secondary | ICD-10-CM | POA: Diagnosis not present

## 2023-10-08 DIAGNOSIS — E1121 Type 2 diabetes mellitus with diabetic nephropathy: Secondary | ICD-10-CM | POA: Diagnosis not present

## 2023-10-08 DIAGNOSIS — K5790 Diverticulosis of intestine, part unspecified, without perforation or abscess without bleeding: Secondary | ICD-10-CM | POA: Diagnosis not present

## 2023-10-09 DIAGNOSIS — M6281 Muscle weakness (generalized): Secondary | ICD-10-CM | POA: Diagnosis not present

## 2023-10-09 DIAGNOSIS — G8929 Other chronic pain: Secondary | ICD-10-CM | POA: Diagnosis not present

## 2023-10-09 DIAGNOSIS — E1121 Type 2 diabetes mellitus with diabetic nephropathy: Secondary | ICD-10-CM | POA: Diagnosis not present

## 2023-10-09 DIAGNOSIS — U071 COVID-19: Secondary | ICD-10-CM | POA: Diagnosis not present

## 2023-10-09 DIAGNOSIS — K5790 Diverticulosis of intestine, part unspecified, without perforation or abscess without bleeding: Secondary | ICD-10-CM | POA: Diagnosis not present

## 2023-10-09 DIAGNOSIS — E1143 Type 2 diabetes mellitus with diabetic autonomic (poly)neuropathy: Secondary | ICD-10-CM | POA: Diagnosis not present

## 2023-10-09 DIAGNOSIS — I739 Peripheral vascular disease, unspecified: Secondary | ICD-10-CM | POA: Diagnosis not present

## 2023-10-09 DIAGNOSIS — G893 Neoplasm related pain (acute) (chronic): Secondary | ICD-10-CM | POA: Diagnosis not present

## 2023-10-09 DIAGNOSIS — R1310 Dysphagia, unspecified: Secondary | ICD-10-CM | POA: Diagnosis not present

## 2023-10-09 DIAGNOSIS — F0390 Unspecified dementia without behavioral disturbance: Secondary | ICD-10-CM | POA: Diagnosis not present

## 2023-10-09 DIAGNOSIS — G311 Senile degeneration of brain, not elsewhere classified: Secondary | ICD-10-CM | POA: Diagnosis not present

## 2023-10-09 DIAGNOSIS — J449 Chronic obstructive pulmonary disease, unspecified: Secondary | ICD-10-CM | POA: Diagnosis not present

## 2023-10-10 DIAGNOSIS — T8189XD Other complications of procedures, not elsewhere classified, subsequent encounter: Secondary | ICD-10-CM | POA: Diagnosis not present

## 2023-10-10 DIAGNOSIS — L89893 Pressure ulcer of other site, stage 3: Secondary | ICD-10-CM | POA: Diagnosis not present

## 2023-10-12 DIAGNOSIS — R2681 Unsteadiness on feet: Secondary | ICD-10-CM | POA: Diagnosis not present

## 2023-10-12 DIAGNOSIS — R1311 Dysphagia, oral phase: Secondary | ICD-10-CM | POA: Diagnosis not present

## 2023-10-12 DIAGNOSIS — M6281 Muscle weakness (generalized): Secondary | ICD-10-CM | POA: Diagnosis not present

## 2023-10-13 DIAGNOSIS — R2681 Unsteadiness on feet: Secondary | ICD-10-CM | POA: Diagnosis not present

## 2023-10-13 DIAGNOSIS — D045 Carcinoma in situ of skin of trunk: Secondary | ICD-10-CM | POA: Diagnosis not present

## 2023-10-13 DIAGNOSIS — R1311 Dysphagia, oral phase: Secondary | ICD-10-CM | POA: Diagnosis not present

## 2023-10-13 DIAGNOSIS — D485 Neoplasm of uncertain behavior of skin: Secondary | ICD-10-CM | POA: Diagnosis not present

## 2023-10-13 DIAGNOSIS — C44529 Squamous cell carcinoma of skin of other part of trunk: Secondary | ICD-10-CM | POA: Diagnosis not present

## 2023-10-13 DIAGNOSIS — M6281 Muscle weakness (generalized): Secondary | ICD-10-CM | POA: Diagnosis not present

## 2023-10-14 DIAGNOSIS — F331 Major depressive disorder, recurrent, moderate: Secondary | ICD-10-CM | POA: Diagnosis not present

## 2023-10-14 DIAGNOSIS — F411 Generalized anxiety disorder: Secondary | ICD-10-CM | POA: Diagnosis not present

## 2023-10-14 DIAGNOSIS — M6281 Muscle weakness (generalized): Secondary | ICD-10-CM | POA: Diagnosis not present

## 2023-10-14 DIAGNOSIS — R1311 Dysphagia, oral phase: Secondary | ICD-10-CM | POA: Diagnosis not present

## 2023-10-14 DIAGNOSIS — R2681 Unsteadiness on feet: Secondary | ICD-10-CM | POA: Diagnosis not present

## 2023-10-15 DIAGNOSIS — E1143 Type 2 diabetes mellitus with diabetic autonomic (poly)neuropathy: Secondary | ICD-10-CM | POA: Diagnosis not present

## 2023-10-15 DIAGNOSIS — F331 Major depressive disorder, recurrent, moderate: Secondary | ICD-10-CM | POA: Diagnosis not present

## 2023-10-15 DIAGNOSIS — M6281 Muscle weakness (generalized): Secondary | ICD-10-CM | POA: Diagnosis not present

## 2023-10-15 DIAGNOSIS — I10 Essential (primary) hypertension: Secondary | ICD-10-CM | POA: Diagnosis not present

## 2023-10-15 DIAGNOSIS — I739 Peripheral vascular disease, unspecified: Secondary | ICD-10-CM | POA: Diagnosis not present

## 2023-10-15 DIAGNOSIS — R2681 Unsteadiness on feet: Secondary | ICD-10-CM | POA: Diagnosis not present

## 2023-10-15 DIAGNOSIS — F419 Anxiety disorder, unspecified: Secondary | ICD-10-CM | POA: Diagnosis not present

## 2023-10-15 DIAGNOSIS — G8929 Other chronic pain: Secondary | ICD-10-CM | POA: Diagnosis not present

## 2023-10-15 DIAGNOSIS — R1311 Dysphagia, oral phase: Secondary | ICD-10-CM | POA: Diagnosis not present

## 2023-10-16 DIAGNOSIS — R2681 Unsteadiness on feet: Secondary | ICD-10-CM | POA: Diagnosis not present

## 2023-10-16 DIAGNOSIS — R1311 Dysphagia, oral phase: Secondary | ICD-10-CM | POA: Diagnosis not present

## 2023-10-16 DIAGNOSIS — M6281 Muscle weakness (generalized): Secondary | ICD-10-CM | POA: Diagnosis not present

## 2023-10-17 DIAGNOSIS — M6281 Muscle weakness (generalized): Secondary | ICD-10-CM | POA: Diagnosis not present

## 2023-10-17 DIAGNOSIS — R1311 Dysphagia, oral phase: Secondary | ICD-10-CM | POA: Diagnosis not present

## 2023-10-17 DIAGNOSIS — R2681 Unsteadiness on feet: Secondary | ICD-10-CM | POA: Diagnosis not present

## 2023-10-20 DIAGNOSIS — M6281 Muscle weakness (generalized): Secondary | ICD-10-CM | POA: Diagnosis not present

## 2023-10-20 DIAGNOSIS — R2681 Unsteadiness on feet: Secondary | ICD-10-CM | POA: Diagnosis not present

## 2023-10-20 DIAGNOSIS — R1311 Dysphagia, oral phase: Secondary | ICD-10-CM | POA: Diagnosis not present

## 2023-10-21 DIAGNOSIS — M6281 Muscle weakness (generalized): Secondary | ICD-10-CM | POA: Diagnosis not present

## 2023-10-21 DIAGNOSIS — R2681 Unsteadiness on feet: Secondary | ICD-10-CM | POA: Diagnosis not present

## 2023-10-21 DIAGNOSIS — R1311 Dysphagia, oral phase: Secondary | ICD-10-CM | POA: Diagnosis not present

## 2023-10-22 DIAGNOSIS — R2681 Unsteadiness on feet: Secondary | ICD-10-CM | POA: Diagnosis not present

## 2023-10-22 DIAGNOSIS — R1311 Dysphagia, oral phase: Secondary | ICD-10-CM | POA: Diagnosis not present

## 2023-10-22 DIAGNOSIS — M6281 Muscle weakness (generalized): Secondary | ICD-10-CM | POA: Diagnosis not present

## 2023-10-23 DIAGNOSIS — R1311 Dysphagia, oral phase: Secondary | ICD-10-CM | POA: Diagnosis not present

## 2023-10-23 DIAGNOSIS — R2681 Unsteadiness on feet: Secondary | ICD-10-CM | POA: Diagnosis not present

## 2023-10-23 DIAGNOSIS — M6281 Muscle weakness (generalized): Secondary | ICD-10-CM | POA: Diagnosis not present

## 2023-10-24 DIAGNOSIS — M6281 Muscle weakness (generalized): Secondary | ICD-10-CM | POA: Diagnosis not present

## 2023-10-24 DIAGNOSIS — D649 Anemia, unspecified: Secondary | ICD-10-CM | POA: Diagnosis not present

## 2023-10-24 DIAGNOSIS — R1311 Dysphagia, oral phase: Secondary | ICD-10-CM | POA: Diagnosis not present

## 2023-10-24 DIAGNOSIS — I10 Essential (primary) hypertension: Secondary | ICD-10-CM | POA: Diagnosis not present

## 2023-10-24 DIAGNOSIS — M1460 Charcot's joint, unspecified site: Secondary | ICD-10-CM | POA: Diagnosis not present

## 2023-10-24 DIAGNOSIS — J309 Allergic rhinitis, unspecified: Secondary | ICD-10-CM | POA: Diagnosis not present

## 2023-10-24 DIAGNOSIS — J449 Chronic obstructive pulmonary disease, unspecified: Secondary | ICD-10-CM | POA: Diagnosis not present

## 2023-10-24 DIAGNOSIS — G893 Neoplasm related pain (acute) (chronic): Secondary | ICD-10-CM | POA: Diagnosis not present

## 2023-10-24 DIAGNOSIS — F419 Anxiety disorder, unspecified: Secondary | ICD-10-CM | POA: Diagnosis not present

## 2023-10-24 DIAGNOSIS — E1143 Type 2 diabetes mellitus with diabetic autonomic (poly)neuropathy: Secondary | ICD-10-CM | POA: Diagnosis not present

## 2023-10-24 DIAGNOSIS — R2681 Unsteadiness on feet: Secondary | ICD-10-CM | POA: Diagnosis not present

## 2023-10-24 DIAGNOSIS — I739 Peripheral vascular disease, unspecified: Secondary | ICD-10-CM | POA: Diagnosis not present

## 2023-10-27 DIAGNOSIS — R1311 Dysphagia, oral phase: Secondary | ICD-10-CM | POA: Diagnosis not present

## 2023-10-27 DIAGNOSIS — M6281 Muscle weakness (generalized): Secondary | ICD-10-CM | POA: Diagnosis not present

## 2023-10-27 DIAGNOSIS — R2681 Unsteadiness on feet: Secondary | ICD-10-CM | POA: Diagnosis not present

## 2023-10-29 DIAGNOSIS — M6281 Muscle weakness (generalized): Secondary | ICD-10-CM | POA: Diagnosis not present

## 2023-10-29 DIAGNOSIS — R2681 Unsteadiness on feet: Secondary | ICD-10-CM | POA: Diagnosis not present

## 2023-10-29 DIAGNOSIS — R1311 Dysphagia, oral phase: Secondary | ICD-10-CM | POA: Diagnosis not present

## 2023-10-30 DIAGNOSIS — R2681 Unsteadiness on feet: Secondary | ICD-10-CM | POA: Diagnosis not present

## 2023-10-30 DIAGNOSIS — M6281 Muscle weakness (generalized): Secondary | ICD-10-CM | POA: Diagnosis not present

## 2023-10-30 DIAGNOSIS — R1311 Dysphagia, oral phase: Secondary | ICD-10-CM | POA: Diagnosis not present

## 2023-10-31 DIAGNOSIS — R1311 Dysphagia, oral phase: Secondary | ICD-10-CM | POA: Diagnosis not present

## 2023-10-31 DIAGNOSIS — M6281 Muscle weakness (generalized): Secondary | ICD-10-CM | POA: Diagnosis not present

## 2023-10-31 DIAGNOSIS — R2681 Unsteadiness on feet: Secondary | ICD-10-CM | POA: Diagnosis not present

## 2023-11-03 DIAGNOSIS — R1311 Dysphagia, oral phase: Secondary | ICD-10-CM | POA: Diagnosis not present

## 2023-11-03 DIAGNOSIS — M6281 Muscle weakness (generalized): Secondary | ICD-10-CM | POA: Diagnosis not present

## 2023-11-03 DIAGNOSIS — R2681 Unsteadiness on feet: Secondary | ICD-10-CM | POA: Diagnosis not present

## 2023-11-04 DIAGNOSIS — F411 Generalized anxiety disorder: Secondary | ICD-10-CM | POA: Diagnosis not present

## 2023-11-04 DIAGNOSIS — F331 Major depressive disorder, recurrent, moderate: Secondary | ICD-10-CM | POA: Diagnosis not present

## 2023-11-11 DIAGNOSIS — I517 Cardiomegaly: Secondary | ICD-10-CM | POA: Diagnosis not present

## 2023-11-11 DIAGNOSIS — J90 Pleural effusion, not elsewhere classified: Secondary | ICD-10-CM | POA: Diagnosis not present

## 2023-11-11 DIAGNOSIS — J449 Chronic obstructive pulmonary disease, unspecified: Secondary | ICD-10-CM | POA: Diagnosis not present

## 2023-11-11 DIAGNOSIS — J986 Disorders of diaphragm: Secondary | ICD-10-CM | POA: Diagnosis not present

## 2023-11-11 DIAGNOSIS — Z8701 Personal history of pneumonia (recurrent): Secondary | ICD-10-CM | POA: Diagnosis not present

## 2023-11-11 DIAGNOSIS — R0902 Hypoxemia: Secondary | ICD-10-CM | POA: Diagnosis not present

## 2024-01-10 DEATH — deceased
